# Patient Record
Sex: Female | Born: 1945 | Race: White | Hispanic: No | Smoking: Never smoker
Health system: Southern US, Community
[De-identification: ages and names within clinical notes are randomized; demographics above are authoritative.]

## PROBLEM LIST (undated history)

## (undated) DIAGNOSIS — J449 Chronic obstructive pulmonary disease, unspecified: Secondary | ICD-10-CM

## (undated) DIAGNOSIS — M1990 Unspecified osteoarthritis, unspecified site: Secondary | ICD-10-CM

## (undated) DIAGNOSIS — B029 Zoster without complications: Secondary | ICD-10-CM

## (undated) HISTORY — PX: ABDOMINAL HYSTERECTOMY: SHX81

## (undated) HISTORY — PX: TONSILLECTOMY: SUR1361

## (undated) HISTORY — PX: APPENDECTOMY: SHX54

## (undated) HISTORY — PX: CERVICAL SPINE SURGERY: SHX589

---

## 1998-12-20 ENCOUNTER — Ambulatory Visit (HOSPITAL_COMMUNITY): Admission: RE | Admit: 1998-12-20 | Discharge: 1998-12-20 | Payer: Self-pay | Admitting: *Deleted

## 1998-12-25 ENCOUNTER — Ambulatory Visit (HOSPITAL_COMMUNITY): Admission: RE | Admit: 1998-12-25 | Discharge: 1998-12-25 | Payer: Self-pay | Admitting: *Deleted

## 1998-12-26 ENCOUNTER — Emergency Department (HOSPITAL_COMMUNITY): Admission: EM | Admit: 1998-12-26 | Discharge: 1998-12-26 | Payer: Self-pay | Admitting: Emergency Medicine

## 1998-12-27 ENCOUNTER — Encounter: Payer: Self-pay | Admitting: Emergency Medicine

## 1999-02-10 ENCOUNTER — Emergency Department (HOSPITAL_COMMUNITY): Admission: EM | Admit: 1999-02-10 | Discharge: 1999-02-10 | Payer: Self-pay | Admitting: Emergency Medicine

## 1999-02-10 ENCOUNTER — Encounter: Payer: Self-pay | Admitting: Emergency Medicine

## 1999-06-05 ENCOUNTER — Other Ambulatory Visit: Admission: RE | Admit: 1999-06-05 | Discharge: 1999-06-05 | Payer: Self-pay | Admitting: Family Medicine

## 1999-10-24 ENCOUNTER — Emergency Department (HOSPITAL_COMMUNITY): Admission: EM | Admit: 1999-10-24 | Discharge: 1999-10-24 | Payer: Self-pay | Admitting: *Deleted

## 2000-06-30 ENCOUNTER — Encounter: Payer: Self-pay | Admitting: Emergency Medicine

## 2000-06-30 ENCOUNTER — Emergency Department (HOSPITAL_COMMUNITY): Admission: EM | Admit: 2000-06-30 | Discharge: 2000-06-30 | Payer: Self-pay | Admitting: Emergency Medicine

## 2001-01-22 ENCOUNTER — Emergency Department (HOSPITAL_COMMUNITY): Admission: EM | Admit: 2001-01-22 | Discharge: 2001-01-22 | Payer: Self-pay | Admitting: Emergency Medicine

## 2001-01-26 ENCOUNTER — Encounter: Payer: Self-pay | Admitting: Emergency Medicine

## 2001-01-26 ENCOUNTER — Ambulatory Visit (HOSPITAL_COMMUNITY): Admission: RE | Admit: 2001-01-26 | Discharge: 2001-01-26 | Payer: Self-pay | Admitting: Emergency Medicine

## 2001-01-26 ENCOUNTER — Emergency Department (HOSPITAL_COMMUNITY): Admission: EM | Admit: 2001-01-26 | Discharge: 2001-01-26 | Payer: Self-pay | Admitting: Emergency Medicine

## 2001-03-10 ENCOUNTER — Ambulatory Visit (HOSPITAL_COMMUNITY): Admission: RE | Admit: 2001-03-10 | Discharge: 2001-03-10 | Payer: Self-pay | Admitting: Neurosurgery

## 2001-03-10 ENCOUNTER — Encounter: Payer: Self-pay | Admitting: Neurosurgery

## 2001-06-07 ENCOUNTER — Encounter
Admission: RE | Admit: 2001-06-07 | Discharge: 2001-06-24 | Payer: Self-pay | Admitting: Physical Medicine & Rehabilitation

## 2001-09-14 ENCOUNTER — Emergency Department (HOSPITAL_COMMUNITY): Admission: EM | Admit: 2001-09-14 | Discharge: 2001-09-14 | Payer: Self-pay | Admitting: Emergency Medicine

## 2001-11-04 ENCOUNTER — Encounter
Admission: RE | Admit: 2001-11-04 | Discharge: 2002-02-02 | Payer: Self-pay | Admitting: Physical Medicine & Rehabilitation

## 2001-11-21 ENCOUNTER — Encounter: Payer: Self-pay | Admitting: Emergency Medicine

## 2001-11-21 ENCOUNTER — Emergency Department (HOSPITAL_COMMUNITY): Admission: EM | Admit: 2001-11-21 | Discharge: 2001-11-21 | Payer: Self-pay | Admitting: *Deleted

## 2002-10-12 ENCOUNTER — Encounter: Payer: Self-pay | Admitting: Emergency Medicine

## 2002-10-12 ENCOUNTER — Inpatient Hospital Stay (HOSPITAL_COMMUNITY): Admission: EM | Admit: 2002-10-12 | Discharge: 2002-10-13 | Payer: Self-pay | Admitting: Emergency Medicine

## 2003-04-15 ENCOUNTER — Encounter: Payer: Self-pay | Admitting: Emergency Medicine

## 2003-04-15 ENCOUNTER — Emergency Department (HOSPITAL_COMMUNITY): Admission: EM | Admit: 2003-04-15 | Discharge: 2003-04-15 | Payer: Self-pay | Admitting: Emergency Medicine

## 2003-11-19 ENCOUNTER — Emergency Department (HOSPITAL_COMMUNITY): Admission: EM | Admit: 2003-11-19 | Discharge: 2003-11-19 | Payer: Self-pay | Admitting: Emergency Medicine

## 2004-01-20 ENCOUNTER — Emergency Department (HOSPITAL_COMMUNITY): Admission: EM | Admit: 2004-01-20 | Discharge: 2004-01-21 | Payer: Self-pay | Admitting: Emergency Medicine

## 2004-06-08 ENCOUNTER — Encounter: Admission: RE | Admit: 2004-06-08 | Discharge: 2004-06-08 | Payer: Self-pay | Admitting: Internal Medicine

## 2004-06-19 ENCOUNTER — Encounter: Admission: RE | Admit: 2004-06-19 | Discharge: 2004-06-19 | Payer: Self-pay | Admitting: Internal Medicine

## 2004-09-15 ENCOUNTER — Inpatient Hospital Stay (HOSPITAL_COMMUNITY): Admission: EM | Admit: 2004-09-15 | Discharge: 2004-09-20 | Payer: Self-pay | Admitting: Emergency Medicine

## 2004-09-24 ENCOUNTER — Ambulatory Visit (HOSPITAL_COMMUNITY): Admission: RE | Admit: 2004-09-24 | Discharge: 2004-09-24 | Payer: Self-pay | Admitting: Neurosurgery

## 2004-10-01 ENCOUNTER — Inpatient Hospital Stay (HOSPITAL_COMMUNITY): Admission: RE | Admit: 2004-10-01 | Discharge: 2004-10-03 | Payer: Self-pay | Admitting: Neurosurgery

## 2005-09-03 ENCOUNTER — Ambulatory Visit (HOSPITAL_COMMUNITY): Admission: RE | Admit: 2005-09-03 | Discharge: 2005-09-03 | Payer: Self-pay | Admitting: Neurosurgery

## 2006-05-11 ENCOUNTER — Emergency Department (HOSPITAL_COMMUNITY): Admission: EM | Admit: 2006-05-11 | Discharge: 2006-05-11 | Payer: Self-pay | Admitting: Emergency Medicine

## 2006-09-12 ENCOUNTER — Inpatient Hospital Stay (HOSPITAL_COMMUNITY): Admission: EM | Admit: 2006-09-12 | Discharge: 2006-09-15 | Payer: Self-pay | Admitting: Emergency Medicine

## 2006-09-14 ENCOUNTER — Encounter: Payer: Self-pay | Admitting: Vascular Surgery

## 2006-10-21 ENCOUNTER — Inpatient Hospital Stay (HOSPITAL_COMMUNITY): Admission: EM | Admit: 2006-10-21 | Discharge: 2006-10-22 | Payer: Self-pay | Admitting: Emergency Medicine

## 2006-10-21 ENCOUNTER — Ambulatory Visit: Payer: Self-pay | Admitting: Cardiology

## 2006-10-22 ENCOUNTER — Encounter: Payer: Self-pay | Admitting: Cardiology

## 2007-08-17 ENCOUNTER — Inpatient Hospital Stay (HOSPITAL_COMMUNITY): Admission: EM | Admit: 2007-08-17 | Discharge: 2007-08-21 | Payer: Self-pay | Admitting: Emergency Medicine

## 2007-08-17 ENCOUNTER — Ambulatory Visit: Payer: Self-pay | Admitting: Cardiovascular Disease

## 2007-08-19 ENCOUNTER — Encounter: Payer: Self-pay | Admitting: Cardiovascular Disease

## 2007-08-21 ENCOUNTER — Other Ambulatory Visit: Payer: Self-pay | Admitting: Cardiology

## 2007-09-15 ENCOUNTER — Emergency Department (HOSPITAL_COMMUNITY): Admission: EM | Admit: 2007-09-15 | Discharge: 2007-09-15 | Payer: Self-pay | Admitting: Emergency Medicine

## 2007-11-24 ENCOUNTER — Encounter: Admission: RE | Admit: 2007-11-24 | Discharge: 2007-11-24 | Payer: Self-pay | Admitting: Family Medicine

## 2007-12-05 ENCOUNTER — Inpatient Hospital Stay (HOSPITAL_COMMUNITY): Admission: EM | Admit: 2007-12-05 | Discharge: 2007-12-09 | Payer: Self-pay | Admitting: Emergency Medicine

## 2007-12-27 ENCOUNTER — Ambulatory Visit (HOSPITAL_COMMUNITY): Admission: RE | Admit: 2007-12-27 | Discharge: 2007-12-27 | Payer: Self-pay | Admitting: Neurosurgery

## 2008-01-21 ENCOUNTER — Ambulatory Visit (HOSPITAL_COMMUNITY): Admission: RE | Admit: 2008-01-21 | Discharge: 2008-01-21 | Payer: Self-pay | Admitting: Neurosurgery

## 2008-01-30 ENCOUNTER — Emergency Department (HOSPITAL_COMMUNITY): Admission: EM | Admit: 2008-01-30 | Discharge: 2008-01-30 | Payer: Self-pay | Admitting: Emergency Medicine

## 2008-02-02 ENCOUNTER — Emergency Department (HOSPITAL_COMMUNITY): Admission: EM | Admit: 2008-02-02 | Discharge: 2008-02-02 | Payer: Self-pay | Admitting: Emergency Medicine

## 2008-02-09 ENCOUNTER — Ambulatory Visit (HOSPITAL_COMMUNITY): Admission: RE | Admit: 2008-02-09 | Discharge: 2008-02-09 | Payer: Self-pay | Admitting: Gastroenterology

## 2008-11-19 ENCOUNTER — Emergency Department (HOSPITAL_COMMUNITY): Admission: EM | Admit: 2008-11-19 | Discharge: 2008-11-19 | Payer: Self-pay | Admitting: Emergency Medicine

## 2009-02-14 ENCOUNTER — Encounter: Admission: RE | Admit: 2009-02-14 | Discharge: 2009-02-14 | Payer: Self-pay | Admitting: Family Medicine

## 2009-03-08 ENCOUNTER — Encounter: Admission: RE | Admit: 2009-03-08 | Discharge: 2009-03-08 | Payer: Self-pay | Admitting: Gastroenterology

## 2009-04-12 ENCOUNTER — Emergency Department (HOSPITAL_COMMUNITY): Admission: EM | Admit: 2009-04-12 | Discharge: 2009-04-12 | Payer: Self-pay | Admitting: Emergency Medicine

## 2009-06-01 IMAGING — CR DG CHEST 1V PORT
1 series · 1 of 1 positions shown · non-contrast
Comparison: 10/21/06.

CLINICAL DATA: 61-year-old female, severe chest pain. 
PORTABLE CHEST ? 1 VIEW:

[view not recorded]
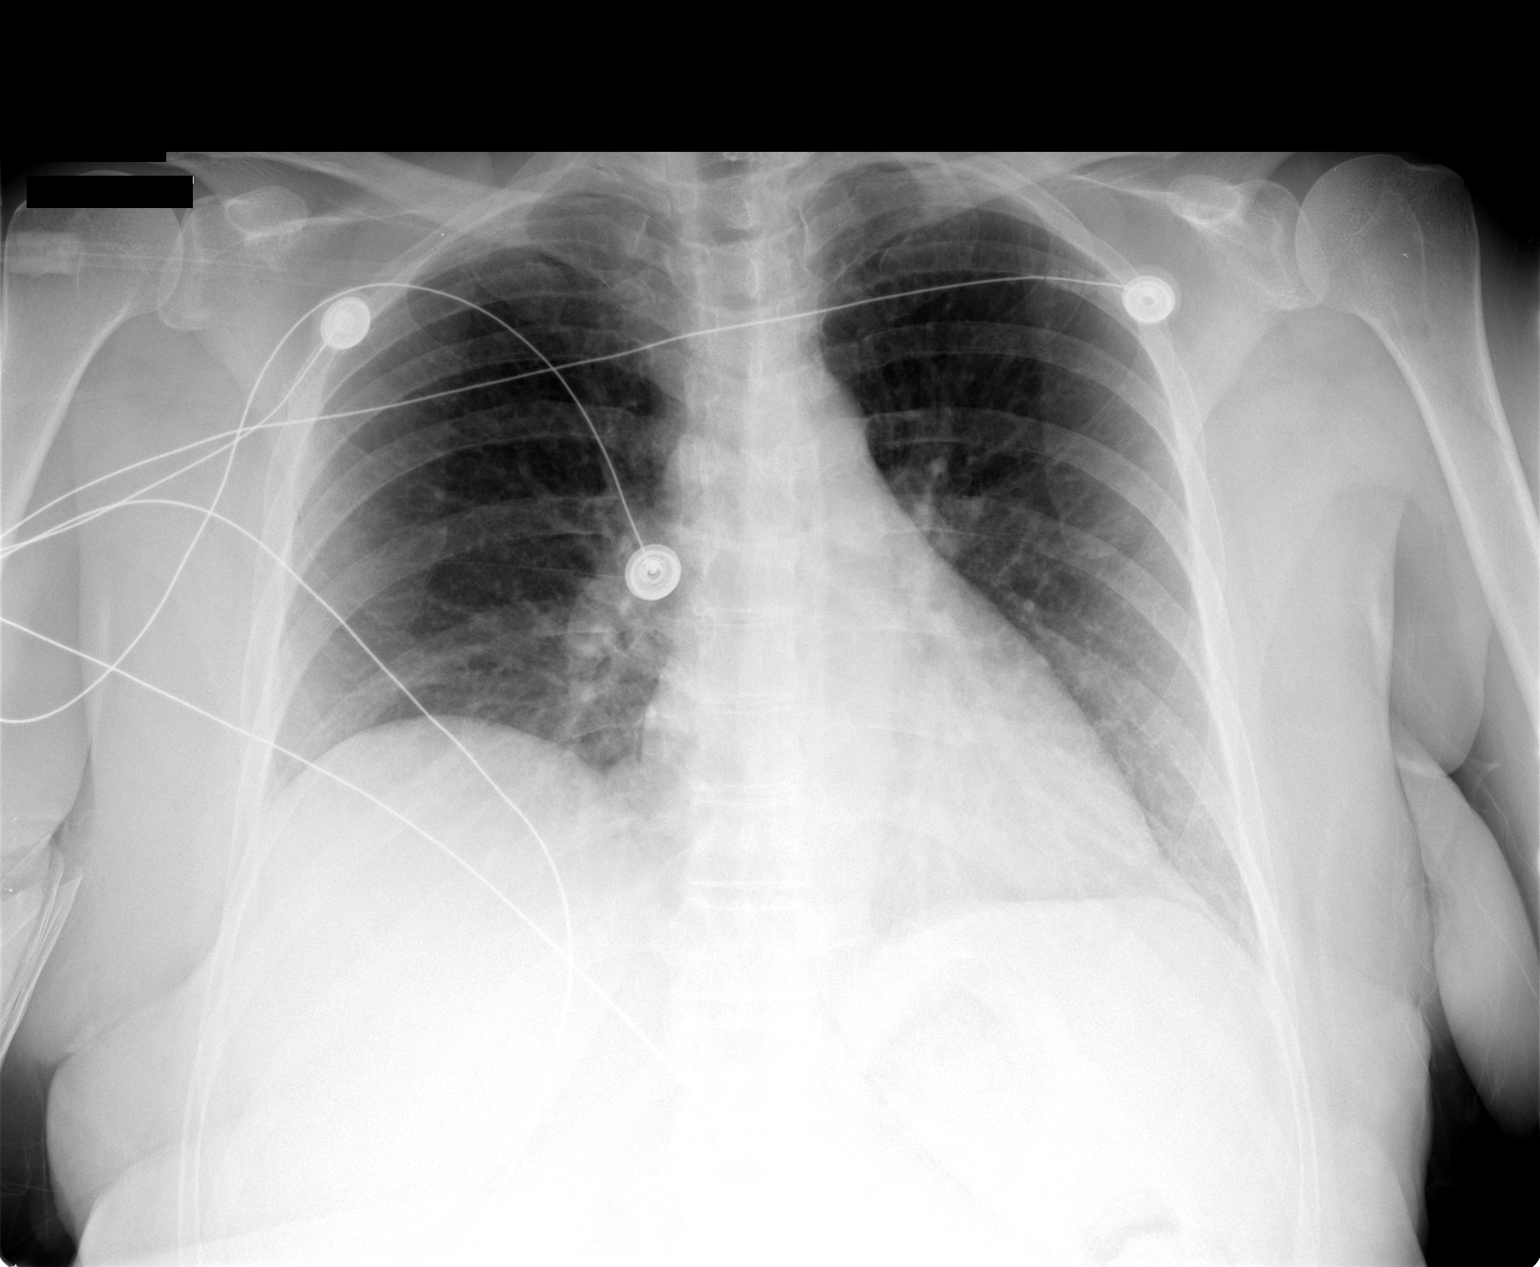

[1 of 1 positions shown; findings below may reference images not displayed]

FINDINGS: Right hemidiaphragm is chronically elevated.  Normal heart size.  No acute pneumonia, edema, effusion, or pneumothorax.  Cervical fusion hardware is noted.
IMPRESSION: Stable exam without acute chest process.

## 2009-08-15 ENCOUNTER — Emergency Department (HOSPITAL_BASED_OUTPATIENT_CLINIC_OR_DEPARTMENT_OTHER): Admission: EM | Admit: 2009-08-15 | Discharge: 2009-08-15 | Payer: Self-pay | Admitting: Emergency Medicine

## 2009-08-18 ENCOUNTER — Emergency Department (HOSPITAL_BASED_OUTPATIENT_CLINIC_OR_DEPARTMENT_OTHER): Admission: EM | Admit: 2009-08-18 | Discharge: 2009-08-18 | Payer: Self-pay | Admitting: Emergency Medicine

## 2010-04-07 ENCOUNTER — Emergency Department (HOSPITAL_BASED_OUTPATIENT_CLINIC_OR_DEPARTMENT_OTHER): Admission: EM | Admit: 2010-04-07 | Discharge: 2010-04-08 | Payer: Self-pay | Admitting: Emergency Medicine

## 2010-06-18 ENCOUNTER — Observation Stay (HOSPITAL_COMMUNITY)
Admission: RE | Admit: 2010-06-18 | Discharge: 2010-06-19 | Payer: Self-pay | Source: Home / Self Care | Admitting: Cardiovascular Disease

## 2010-06-18 ENCOUNTER — Encounter: Payer: Self-pay | Admitting: Emergency Medicine

## 2010-08-18 ENCOUNTER — Emergency Department (HOSPITAL_BASED_OUTPATIENT_CLINIC_OR_DEPARTMENT_OTHER): Admission: EM | Admit: 2010-08-18 | Discharge: 2010-08-18 | Payer: Self-pay | Admitting: Pediatrics

## 2010-11-15 ENCOUNTER — Emergency Department (INDEPENDENT_AMBULATORY_CARE_PROVIDER_SITE_OTHER): Payer: Medicare Other

## 2010-11-15 ENCOUNTER — Emergency Department (HOSPITAL_BASED_OUTPATIENT_CLINIC_OR_DEPARTMENT_OTHER)
Admission: EM | Admit: 2010-11-15 | Discharge: 2010-11-15 | Disposition: A | Discharge status: Home / Self Care | Payer: Medicare Other | Attending: Emergency Medicine | Admitting: Emergency Medicine

## 2010-11-15 DIAGNOSIS — R05 Cough: Secondary | ICD-10-CM | POA: Insufficient documentation

## 2010-11-15 DIAGNOSIS — R059 Cough, unspecified: Secondary | ICD-10-CM | POA: Insufficient documentation

## 2010-11-15 DIAGNOSIS — J40 Bronchitis, not specified as acute or chronic: Secondary | ICD-10-CM | POA: Insufficient documentation

## 2010-11-15 DIAGNOSIS — R0989 Other specified symptoms and signs involving the circulatory and respiratory systems: Secondary | ICD-10-CM

## 2010-11-15 DIAGNOSIS — J329 Chronic sinusitis, unspecified: Secondary | ICD-10-CM | POA: Insufficient documentation

## 2010-11-17 ENCOUNTER — Emergency Department (HOSPITAL_BASED_OUTPATIENT_CLINIC_OR_DEPARTMENT_OTHER)
Admission: EM | Admit: 2010-11-17 | Discharge: 2010-11-17 | Disposition: A | Discharge status: Home / Self Care | Payer: Self-pay | Attending: Emergency Medicine | Admitting: Emergency Medicine

## 2010-11-17 DIAGNOSIS — R42 Dizziness and giddiness: Secondary | ICD-10-CM | POA: Insufficient documentation

## 2010-11-17 DIAGNOSIS — R0989 Other specified symptoms and signs involving the circulatory and respiratory systems: Secondary | ICD-10-CM | POA: Insufficient documentation

## 2010-11-17 DIAGNOSIS — R0609 Other forms of dyspnea: Secondary | ICD-10-CM | POA: Insufficient documentation

## 2010-11-17 DIAGNOSIS — R059 Cough, unspecified: Secondary | ICD-10-CM | POA: Insufficient documentation

## 2010-11-17 DIAGNOSIS — R05 Cough: Secondary | ICD-10-CM | POA: Insufficient documentation

## 2010-11-17 DIAGNOSIS — R5381 Other malaise: Secondary | ICD-10-CM | POA: Insufficient documentation

## 2010-11-17 DIAGNOSIS — J40 Bronchitis, not specified as acute or chronic: Secondary | ICD-10-CM | POA: Insufficient documentation

## 2010-11-17 DIAGNOSIS — R002 Palpitations: Secondary | ICD-10-CM | POA: Insufficient documentation

## 2010-11-17 DIAGNOSIS — R630 Anorexia: Secondary | ICD-10-CM | POA: Insufficient documentation

## 2010-11-17 LAB — POCT CARDIAC MARKERS
CKMB, poc: 1.8 ng/mL (ref 1.0–8.0)
Myoglobin, poc: 118 ng/mL (ref 12–200)
Troponin i, poc: <0.05 ng/mL (ref 0.00–0.09)

## 2010-11-17 LAB — BASIC METABOLIC PANEL
CO2: 22 mEq/L (ref 19–32)
Calcium: 9.2 mg/dL (ref 8.4–10.5)
Chloride: 109 mEq/L (ref 96–112)
GFR calc Af Amer: >60 mL/min (ref >60)
Potassium: 4.9 mEq/L (ref 3.5–5.1)
Sodium: 144 mEq/L (ref 135–145)

## 2010-11-17 LAB — DIFFERENTIAL
Basophils Absolute: 0 10*3/uL (ref 0.0–0.1)
Basophils Relative: 1 % (ref 0–1)
Lymphocytes Relative: 20 % (ref 12–46)
Neutro Abs: 3.5 10*3/uL (ref 1.7–7.7)
Neutrophils Relative %: 60 % (ref 43–77)

## 2010-11-17 LAB — CBC
Hemoglobin: 13.4 g/dL (ref 12.0–15.0)
MCHC: 35.3 g/dL (ref 30.0–36.0)
RBC: 4.59 MIL/uL (ref 3.87–5.11)
WBC: 5.9 10*3/uL (ref 4.0–10.5)

## 2010-11-21 ENCOUNTER — Emergency Department (HOSPITAL_BASED_OUTPATIENT_CLINIC_OR_DEPARTMENT_OTHER)
Admission: EM | Admit: 2010-11-21 | Discharge: 2010-11-21 | Disposition: A | Discharge status: Home / Self Care | Payer: Medicare Other | Attending: Emergency Medicine | Admitting: Emergency Medicine

## 2010-11-21 ENCOUNTER — Emergency Department (INDEPENDENT_AMBULATORY_CARE_PROVIDER_SITE_OTHER): Payer: Medicare Other

## 2010-11-21 DIAGNOSIS — R059 Cough, unspecified: Secondary | ICD-10-CM | POA: Insufficient documentation

## 2010-11-21 DIAGNOSIS — R05 Cough: Secondary | ICD-10-CM

## 2010-11-21 DIAGNOSIS — J40 Bronchitis, not specified as acute or chronic: Secondary | ICD-10-CM | POA: Insufficient documentation

## 2010-11-23 ENCOUNTER — Emergency Department (HOSPITAL_BASED_OUTPATIENT_CLINIC_OR_DEPARTMENT_OTHER)
Admission: EM | Admit: 2010-11-23 | Discharge: 2010-11-24 | Disposition: A | Discharge status: Home / Self Care | Payer: Medicare Other | Attending: Emergency Medicine | Admitting: Emergency Medicine

## 2010-11-23 ENCOUNTER — Emergency Department (INDEPENDENT_AMBULATORY_CARE_PROVIDER_SITE_OTHER): Payer: Self-pay

## 2010-11-23 DIAGNOSIS — J40 Bronchitis, not specified as acute or chronic: Secondary | ICD-10-CM | POA: Insufficient documentation

## 2010-11-23 DIAGNOSIS — R071 Chest pain on breathing: Secondary | ICD-10-CM | POA: Insufficient documentation

## 2010-11-23 DIAGNOSIS — R0789 Other chest pain: Secondary | ICD-10-CM | POA: Insufficient documentation

## 2010-11-23 DIAGNOSIS — R079 Chest pain, unspecified: Secondary | ICD-10-CM

## 2010-11-23 LAB — DIFFERENTIAL
Basophils Absolute: 0.1 10*3/uL (ref 0.0–0.1)
Basophils Relative: 1 % (ref 0–1)
Monocytes Absolute: 0.8 10*3/uL (ref 0.1–1.0)
Neutro Abs: 7.4 10*3/uL (ref 1.7–7.7)

## 2010-11-23 LAB — COMPREHENSIVE METABOLIC PANEL
Alkaline Phosphatase: 117 U/L (ref 39–117)
BUN: 8 mg/dL (ref 6–23)
CO2: 26 mEq/L (ref 19–32)
Calcium: 9.8 mg/dL (ref 8.4–10.5)
GFR calc non Af Amer: >60 mL/min (ref >60)
Glucose, Bld: 108 mg/dL — ABNORMAL HIGH (ref 70–99)
Potassium: 3.9 mEq/L (ref 3.5–5.1)
Total Protein: 8.3 g/dL (ref 6.0–8.3)

## 2010-11-23 LAB — CBC
HCT: 37.8 % (ref 36.0–46.0)
Hemoglobin: 13.4 g/dL (ref 12.0–15.0)
MCHC: 35.4 g/dL (ref 30.0–36.0)
MCV: 82.9 fL (ref 78.0–100.0)
RDW: 13.4 % (ref 11.5–15.5)

## 2010-11-23 MED ORDER — IOHEXOL 350 MG/ML SOLN
80.0000 mL | Freq: Once | INTRAVENOUS | Status: AC | PRN
  Administered 2010-11-23: 80 mL via INTRAVENOUS

## 2010-11-26 LAB — URINALYSIS, ROUTINE W REFLEX MICROSCOPIC
Glucose, UA: NEGATIVE mg/dL
Ketones, ur: NEGATIVE mg/dL
Nitrite: NEGATIVE
Protein, ur: NEGATIVE mg/dL

## 2010-11-26 LAB — URINE MICROSCOPIC-ADD ON

## 2010-11-27 ENCOUNTER — Emergency Department (HOSPITAL_BASED_OUTPATIENT_CLINIC_OR_DEPARTMENT_OTHER)
Admission: EM | Admit: 2010-11-27 | Discharge: 2010-11-28 | Disposition: A | Discharge status: Home / Self Care | Payer: Medicare Other | Source: Home / Self Care | Attending: Emergency Medicine | Admitting: Emergency Medicine

## 2010-11-27 ENCOUNTER — Emergency Department (INDEPENDENT_AMBULATORY_CARE_PROVIDER_SITE_OTHER): Payer: Medicare Other

## 2010-11-27 DIAGNOSIS — R079 Chest pain, unspecified: Secondary | ICD-10-CM

## 2010-11-27 DIAGNOSIS — R05 Cough: Secondary | ICD-10-CM | POA: Insufficient documentation

## 2010-11-27 DIAGNOSIS — R112 Nausea with vomiting, unspecified: Secondary | ICD-10-CM

## 2010-11-27 DIAGNOSIS — R059 Cough, unspecified: Secondary | ICD-10-CM | POA: Insufficient documentation

## 2010-11-27 DIAGNOSIS — R1115 Cyclical vomiting syndrome unrelated to migraine: Secondary | ICD-10-CM | POA: Insufficient documentation

## 2010-11-27 DIAGNOSIS — J40 Bronchitis, not specified as acute or chronic: Secondary | ICD-10-CM | POA: Insufficient documentation

## 2010-11-27 LAB — COMPREHENSIVE METABOLIC PANEL
ALT: 62 U/L — ABNORMAL HIGH (ref 0–35)
AST: 38 U/L — ABNORMAL HIGH (ref 0–37)
Albumin: 3.8 g/dL (ref 3.5–5.2)
Albumin: 4.7 g/dL (ref 3.5–5.2)
Alkaline Phosphatase: 108 U/L (ref 39–117)
Alkaline Phosphatase: 81 U/L (ref 39–117)
BUN: 10 mg/dL (ref 6–23)
CO2: 22 mEq/L (ref 19–32)
CO2: 24 mEq/L (ref 19–32)
Chloride: 104 mEq/L (ref 96–112)
Chloride: 110 mEq/L (ref 96–112)
Creatinine, Ser: 0.8 mg/dL (ref 0.4–1.2)
GFR calc Af Amer: >60 mL/min (ref >60)
GFR calc non Af Amer: >60 mL/min (ref >60)
Glucose, Bld: 104 mg/dL — ABNORMAL HIGH (ref 70–99)
Potassium: 3.7 mEq/L (ref 3.5–5.1)
Potassium: 4.3 mEq/L (ref 3.5–5.1)
Total Bilirubin: 0.5 mg/dL (ref 0.3–1.2)
Total Bilirubin: 0.8 mg/dL (ref 0.3–1.2)

## 2010-11-27 LAB — DIFFERENTIAL
Basophils Absolute: 0 10*3/uL (ref 0.0–0.1)
Eosinophils Relative: 0 % (ref 0–5)
Lymphocytes Relative: 5 % — ABNORMAL LOW (ref 12–46)
Monocytes Relative: 9 % (ref 3–12)
Neutro Abs: 19.4 10*3/uL — ABNORMAL HIGH (ref 1.7–7.7)

## 2010-11-27 LAB — CBC
HCT: 38.9 % (ref 36.0–46.0)
Hemoglobin: 13.3 g/dL (ref 12.0–15.0)
Hemoglobin: 13.7 g/dL (ref 12.0–15.0)
MCH: 29.8 pg (ref 26.0–34.0)
MCV: 87 fL (ref 78.0–100.0)
Platelets: 229 10*3/uL (ref 150–400)
Platelets: 266 10*3/uL (ref 150–400)
RBC: 4.47 MIL/uL (ref 3.87–5.11)
RBC: 4.6 MIL/uL (ref 3.87–5.11)
WBC: 22.5 10*3/uL — ABNORMAL HIGH (ref 4.0–10.5)
WBC: 8.3 10*3/uL (ref 4.0–10.5)

## 2010-11-27 LAB — CARDIAC PANEL(CRET KIN+CKTOT+MB+TROPI)
CK, MB: 1.1 ng/mL (ref 0.3–4.0)
Relative Index: INVALID (ref 0.0–2.5)
Relative Index: INVALID (ref 0.0–2.5)
Total CK: 46 U/L (ref 7–177)

## 2010-11-27 LAB — HEMOGLOBIN A1C
Hgb A1c MFr Bld: 5.8 % — ABNORMAL HIGH (ref <5.7)
Mean Plasma Glucose: 120 mg/dL — ABNORMAL HIGH (ref <117)

## 2010-11-27 LAB — LIPID PANEL
LDL Cholesterol: 137 mg/dL — ABNORMAL HIGH (ref 0–99)
Triglycerides: 189 mg/dL — ABNORMAL HIGH (ref <150)
VLDL: 38 mg/dL (ref 0–40)

## 2010-11-27 LAB — MAGNESIUM: Magnesium: 2.1 mg/dL (ref 1.5–2.5)

## 2010-11-27 LAB — LIPASE, BLOOD: Lipase: 37 U/L (ref 23–300)

## 2010-11-28 ENCOUNTER — Inpatient Hospital Stay (HOSPITAL_COMMUNITY)
Admission: AD | Admit: 2010-11-28 | Discharge: 2010-11-30 | DRG: 194 | Disposition: A | Discharge status: Home / Self Care | Payer: Medicare Other | Source: Other Acute Inpatient Hospital | Attending: Internal Medicine | Admitting: Internal Medicine

## 2010-11-28 ENCOUNTER — Inpatient Hospital Stay (HOSPITAL_COMMUNITY): Payer: Medicare Other

## 2010-11-28 DIAGNOSIS — E785 Hyperlipidemia, unspecified: Secondary | ICD-10-CM | POA: Diagnosis present

## 2010-11-28 DIAGNOSIS — X58XXXA Exposure to other specified factors, initial encounter: Secondary | ICD-10-CM

## 2010-11-28 DIAGNOSIS — K219 Gastro-esophageal reflux disease without esophagitis: Secondary | ICD-10-CM | POA: Diagnosis present

## 2010-11-28 DIAGNOSIS — J189 Pneumonia, unspecified organism: Principal | ICD-10-CM | POA: Diagnosis present

## 2010-11-28 DIAGNOSIS — D72829 Elevated white blood cell count, unspecified: Secondary | ICD-10-CM | POA: Diagnosis present

## 2010-11-28 DIAGNOSIS — Z8673 Personal history of transient ischemic attack (TIA), and cerebral infarction without residual deficits: Secondary | ICD-10-CM

## 2010-11-28 DIAGNOSIS — S2239XA Fracture of one rib, unspecified side, initial encounter for closed fracture: Secondary | ICD-10-CM | POA: Diagnosis present

## 2010-11-28 DIAGNOSIS — R071 Chest pain on breathing: Secondary | ICD-10-CM | POA: Diagnosis present

## 2010-11-28 DIAGNOSIS — R112 Nausea with vomiting, unspecified: Secondary | ICD-10-CM | POA: Diagnosis present

## 2010-11-28 LAB — URINALYSIS, ROUTINE W REFLEX MICROSCOPIC
Bilirubin Urine: NEGATIVE
Ketones, ur: NEGATIVE mg/dL
Nitrite: NEGATIVE
Specific Gravity, Urine: 1.024 (ref 1.005–1.030)
Urobilinogen, UA: 0.2 mg/dL (ref 0.0–1.0)
pH: 7 (ref 5.0–8.0)

## 2010-11-28 LAB — CARDIAC PANEL(CRET KIN+CKTOT+MB+TROPI)
CK, MB: 1 ng/mL (ref 0.3–4.0)
Total CK: 33 U/L (ref 7–177)

## 2010-11-28 LAB — POCT I-STAT, CHEM 8
Glucose, Bld: 114 mg/dL — ABNORMAL HIGH (ref 70–99)
HCT: 41 % (ref 36.0–46.0)
Hemoglobin: 13.9 g/dL (ref 12.0–15.0)
Potassium: 3.7 mEq/L (ref 3.5–5.1)
Sodium: 141 mEq/L (ref 135–145)

## 2010-11-28 LAB — CBC
MCH: 31 pg (ref 26.0–34.0)
MCHC: 35.6 g/dL (ref 30.0–36.0)
Platelets: 181 10*3/uL (ref 150–400)
RDW: 13.2 % (ref 11.5–15.5)

## 2010-11-28 LAB — DIFFERENTIAL
Basophils Absolute: 0 10*3/uL (ref 0.0–0.1)
Basophils Relative: 0 % (ref 0–1)
Eosinophils Absolute: 0.1 10*3/uL (ref 0.0–0.7)
Monocytes Relative: 5 % (ref 3–12)
Neutrophils Relative %: 66 % (ref 43–77)

## 2010-11-28 LAB — POCT CARDIAC MARKERS: CKMB, poc: <1 ng/mL — ABNORMAL LOW (ref 1.0–8.0)

## 2010-11-28 LAB — D-DIMER, QUANTITATIVE: D-Dimer, Quant: 0.22 ug/mL-FEU (ref 0.00–0.48)

## 2010-11-28 LAB — CK TOTAL AND CKMB (NOT AT ARMC): CK, MB: 1.2 ng/mL (ref 0.3–4.0)

## 2010-11-29 LAB — COMPREHENSIVE METABOLIC PANEL
AST: 16 U/L (ref 0–37)
Albumin: 2.9 g/dL — ABNORMAL LOW (ref 3.5–5.2)
BUN: 8 mg/dL (ref 6–23)
Chloride: 107 mEq/L (ref 96–112)
Creatinine, Ser: 0.74 mg/dL (ref 0.4–1.2)
GFR calc Af Amer: >60 mL/min (ref >60)
Total Protein: 5.6 g/dL — ABNORMAL LOW (ref 6.0–8.3)

## 2010-11-29 LAB — CBC
MCH: 28.9 pg (ref 26.0–34.0)
MCV: 85.7 fL (ref 78.0–100.0)
Platelets: 173 10*3/uL (ref 150–400)
RBC: 3.84 MIL/uL — ABNORMAL LOW (ref 3.87–5.11)
RDW: 13.8 % (ref 11.5–15.5)
WBC: 7.6 10*3/uL (ref 4.0–10.5)

## 2010-11-29 LAB — URINE CULTURE
Colony Count: NO GROWTH
Culture  Setup Time: 201203151025
Culture: NO GROWTH

## 2010-11-30 LAB — CBC
HCT: 36.1 % (ref 36.0–46.0)
MCHC: 34.3 g/dL (ref 30.0–36.0)
Platelets: 201 10*3/uL (ref 150–400)
RDW: 14.1 % (ref 11.5–15.5)
WBC: 7.5 10*3/uL (ref 4.0–10.5)

## 2010-11-30 LAB — BASIC METABOLIC PANEL
Calcium: 8.5 mg/dL (ref 8.4–10.5)
GFR calc non Af Amer: >60 mL/min (ref >60)
Glucose, Bld: 99 mg/dL (ref 70–99)
Sodium: 138 mEq/L (ref 135–145)

## 2010-12-04 LAB — CULTURE, BLOOD (ROUTINE X 2)
Culture  Setup Time: 201203151019
Culture  Setup Time: 201203151019
Culture: NO GROWTH

## 2010-12-16 NOTE — H&P (Signed)
[name], BELFIELD NO.:  [id number]  MEDICAL RECORD NO.:  [id number]           PATIENT TYPE:  I  LOCATION:  5118                         FACILITY:  [facility]  PHYSICIAN:  Eduard Clos, MDDATE OF BIRTH:  1946-07-22  DATE OF ADMISSION:  11/28/2010 DATE OF DISCHARGE:                             HISTORY & PHYSICAL   PRIMARY CARE PHYSICIAN:  Unassigned.  CHIEF COMPLAINT:  Right-sided chest pain.  HISTORY OF PRESENT ILLNESS:  A 65 year old female with previous history of having CVA, dyslipidemia, and anxiety, has been experiencing some right-sided chest pain with shortness of breath.  The patient has been having these symptoms over the last 3 weeks with cough and phlegm.  The patient had originally come to the ER 5 days ago where at that time the patient did have CT angio of chest and it was negative for PE, was showing mild trace pleural fluid on the right side with some central peribronchial thickening.  The patient was discharged on Z-Pak and prednisone despite taking which patient came last night to the ER back in addition to having nausea and vomiting.  The patient still has a stabbing-type of right-sided pleuritic chest pain.  The patient had a repeat chest x-ray which only shows features of bronchitis.  The patient has been admitted for further workup.  In addition, the patient at this time has significant leukocytosis.  Denies any dizziness or loss of consciousness.  Denied any focal deficit.  Denied any headache, visual symptoms, any dysuria, discharge, or diarrhea.  The patient's pain is more on the right chest, below the right lower ribs, does not have specifically pain in the right upper quadrant.  At this time is also concerning for cholecystitis.  PAST MEDICAL HISTORY:  History of CVA; dyslipidemia; GERD; diverticulosis; anxiety; paranoid, which the patient had followed with Dr. Jeannetta Nap and was told was normal; history of C5-C6 and  C6-C7 diskectomies; bilateral laminectomy and interbody fusion with allograft placed at C5 and C6-7; status post hysterectomy; bilateral salpingo- oophorectomy; status post appendectomy; and status post excision of benign tumor.  MEDICATIONS PRIOR TO ADMISSION:  Fish oil, flaxseed, and aspirin.  ALLERGIES:  MORPHINE causes hives, PHENERGAN causes itching.  SOCIAL HISTORY:  The patient is married.  Denies smoking cigarettes, drinking alcohol, or using illegal drugs.  FAMILY HISTORY:  Significant for coronary artery disease and diabetes. Father had lung cancer.  Brother had died from kidney cancer.  REVIEW OF SYSTEMS:  As per history of present illness, nothing else significant.  PHYSICAL EXAMINATION:  GENERAL:  The patient examined at bedside, not in acute distress. VITAL SIGNS:  Blood pressure is 128/70, pulse is 90 per minute, temperature 97.8, respirations 18 per minute, O2 sat is 96%.  HEENT: Anicteric.  No pallor.  No discharge from ears, eyes, nose, or mouth. CHEST:  Bilateral air entry present.  No rhonchi, no crepitation. HEART:  S1 and S2 heard. ABDOMEN:  Soft, nontender.  Bowel sounds heard. CNS:  Alert, awake, and oriented to time, place, and person.  Moves upper and lower extremities 5/5. EXTREMITIES:  Peripheral pulses felt.  No edema.  LABORATORY  DATA:  EKG has been ordered.  Chest x-ray done today shows mild central peribronchial thickening noted, better characterized on the recent CTA of the chest from November 23, 2010.  No evidence of new focal consolidation, known trace right pleural effusion is not characterized on radiograph.  CT angio of chest done on November 23, 2010, shows central bronchial wall thickening, trace right effusion and mildly prominent right hilar lymph node.  Findings could represent bronchitis or other small airways infectious etiology, reactive airway disease could have a similar appearance, but the presence of a right effusion makes this  less likely.  No specific evidence for pulmonary embolism is identified. CBC; WBC is 22.5, hemoglobin is 13.7, hematocrit 38.5, platelets 266, neutrophils 86%.  Complete metabolic panel; sodium 145, potassium 3.7, chloride 104, carbon dioxide 22, glucose 127, BUN 19, creatinine 0.7, total bilirubin is 0.8, alkaline phosphatase 108, AST 38, ALT 62, total protein 7.8, albumin is 4.7, calcium 9.5, lipase 77.  ASSESSMENT: 1. Pleuritic type right-sided chest pain. 2. Nausea and vomiting. 3. Mild leukocytosis. 4. History of dyslipidemia. 5. History of cerebrovascular accident.  PLAN: 1. At this time, admit patient to medical floor. 2. For her pleuritic-type chest pain, at this time is concerning for     any loculated pleural effusion and a pneumonic process.  We are     going to start empirically vancomycin and Levaquin at this time.     We are going to get a repeat CT of chest to confirm there is no     loculated effusion.  If there is, then we may have to call CT     Surgery.  At the same time, it is also concerning for any     gallbladder pathology as patient has significant nausea, vomiting.     We will also get a sonogram of the abdomen. 3. Further recommendation based on these test orders.     Eduard Clos, MD     ANK/MEDQ  D:  11/28/2010  T:  11/28/2010  Job:  [id number]  Electronically Signed by Midge Minium MD on 12/16/2010 07:51:19 AM

## 2010-12-19 ENCOUNTER — Emergency Department (HOSPITAL_BASED_OUTPATIENT_CLINIC_OR_DEPARTMENT_OTHER)
Admission: EM | Admit: 2010-12-19 | Discharge: 2010-12-19 | Disposition: A | Discharge status: Home / Self Care | Payer: Self-pay | Attending: Emergency Medicine | Admitting: Emergency Medicine

## 2010-12-19 DIAGNOSIS — K1370 Unspecified lesions of oral mucosa: Secondary | ICD-10-CM | POA: Insufficient documentation

## 2010-12-19 LAB — URINALYSIS, ROUTINE W REFLEX MICROSCOPIC
Bilirubin Urine: NEGATIVE
Ketones, ur: NEGATIVE mg/dL
Nitrite: NEGATIVE
Specific Gravity, Urine: 1.009 (ref 1.005–1.030)
Urobilinogen, UA: 0.2 mg/dL (ref 0.0–1.0)

## 2011-01-28 NOTE — Discharge Summary (Signed)
[name], GOATES NO.:  [id number]   MEDICAL RECORD NO.:  [id number]          PATIENT TYPE:  INP   LOCATION:  1334                         FACILITY:  [facility]   PHYSICIAN:  Hillery Aldo, M.D.   DATE OF BIRTH:  1945/10/02   DATE OF ADMISSION:  12/05/2007  DATE OF DISCHARGE:  12/08/2007                               DISCHARGE SUMMARY   PRIMARY CARE PHYSICIAN:  Windle Guard, M.D.   DISCHARGE DIAGNOSES:  1. Acute diverticulitis.  2. Gastroesophageal reflux disease.  3. Transient hematuria.  4. Thyroid nodules, follow up by primary care physician recommended.  5. Anxiety.  6. History of dyslipidemia.  7. History of pulmonary embolism.   DISCHARGE MEDICATIONS:  1. Fish oil capsule once daily.  2. Aspirin 325 mg daily.  3. Mucinex 1 tablet twice daily as needed.  4. Flaxseed oil 2 capsules daily.  5. Cipro 500 mg q.12 h. x10 days.  6. Flagyl 500 mg q.8 h. x10 days.  7. Vicodin 5/500 mg 1-2 tablets q.6 h. p.r.n. pain.  8. Phenergan 25 mg q.4 h. p.r.n. nausea or vomiting.   CONSULTATIONS:  None.   BRIEF ADMISSION HPI:  The patient is a 65 year old female who presented  to the hospital with a chief complaint of left lower quadrant pain,  nausea, subjective fever and chills.  For the full details, please see  the dictated report by Dr. Sherrie Mustache.   PROCEDURES AND DIAGNOSTIC STUDIES:  CT scan of the abdomen and pelvis on  December 05, 2007, showed no acute abdominal process with distal descending  diverticulitis without abscess.   DISCHARGE LABORATORY VALUES:  Sodium was 138, potassium 4, chloride 104,  bicarb 27, BUN 6, creatinine 0.81, glucose 89.  White blood cell count  was 4.6, hemoglobin 12.5, hematocrit 35.6, platelets 171.   HOSPITAL COURSE:  1. Acute diverticulitis:  The patient was admitted and a diagnostic      workup undertaken with findings as noted above.  She was put on a      combination of IV Cipro and Flagyl for acute diverticulitis.  Initially she was kept on bowel rest and her diet gradually      advanced.  At this point, she is tolerating a diet without any      further problems with nausea or vomiting.  Her abdominal pain is      much improved.  The patient will be transitioned over to p.o.      antibiotics and if she is able to tolerate them, will be discharged      later on this afternoon.  2. Gastroesophageal reflux disease:  The patient was empirically put      on proton pump inhibitor therapy.  3. Transient hematuria:  The patient did have an episode of hematuria      with a small visible clot as noted by nursing staff.  She had no      history of previous blood in the urine.  A urinalysis was done      which did not show any evidence of hematuria.  Lovenox was  discontinued with no resumption of complaints of blood in the      urine.  4. History of thyroid nodules.  The patient should follow up with her      primary care [name] with regard to these.  5. Anxiety:  The patient was given p.r.n. anxiolytics without any      return of anxiety.  6. History of dyslipidemia:  The patient can continue her flaxseed oil      and fish oil on discharge.   DISPOSITION:  The patient is approaching medical stability and if she is  able to tolerate p.o. antibiotics and her diet, she will be discharged  later on this afternoon.      Hillery Aldo, M.D.  Electronically Signed     CR/MEDQ  D:  12/08/2007  T:  12/08/2007  Job:  [id number]   cc:   Windle Guard, M.D.  Fax: [phone]

## 2011-01-28 NOTE — Op Note (Signed)
[name], [name]               ACCOUNT NO.:  [id number]   MEDICAL RECORD NO.:  [id number]          PATIENT TYPE:  AMB   LOCATION:  SDS                          FACILITY:  [facility]   PHYSICIAN:  Hilda Lias, M.D.   DATE OF BIRTH:  16-Dec-1945   DATE OF PROCEDURE:  01/21/2008  DATE OF DISCHARGE:  01/21/2008                               OPERATIVE REPORT   PREOPERATIVE DIAGNOSIS:  Bilateral carpal tunnel syndrome, right worse  than left one.   POSTOPERATIVE DIAGNOSIS:  Bilateral carpal tunnel syndrome, right worse  than left one.   PROCEDURE:  Decompression of the right median nerve.   ANESTHESIA:  Local plus IV sedation.   CLINICAL HISTORY:  [name] is a lady who is being followed because  of back pain, arm pain, and the right hand is getting to the point where  she is unable to sleep and cannot open and close her hand.  Made the  diagnosis of  severe carpal tunnel syndrome, right worse than the left.  Surgery was advised and the risk was explained to her in the office.   PROCEDURE:  [name] was taken to the OR and then the right hand and  arm was cleaned with DuraPrep.  Drapes were applied.  IV sedation was  started and infiltration of the base of the thumb with 10 mL of  Xylocaine was done.  Then, a 0.5-mm incision was made through the skin  through the thick volar ligament and through the thick carpal ligament  with decompression, distal and proximal.  At the end, we had a good  decompression of the median nerve, which was found to be flat and  yellowish, then the area was irrigated.  Hemostasis was done with  bipolar and the wound was closed with nylon.           ______________________________  Hilda Lias, M.D.     EB/MEDQ  D:  01/21/2008  T:  01/22/2008  Job:  [id number]

## 2011-01-28 NOTE — Cardiovascular Report (Signed)
NAMENAKINA, SPATZ NO.:  [id number]   MEDICAL RECORD NO.:  [id number]          PATIENT TYPE:  INP   LOCATION:  2005                         FACILITY:  [facility]   PHYSICIAN:  Everardo Beals. Juanda Chance, MD, FACCDATE OF BIRTH:  1945-11-17   DATE OF PROCEDURE:  DATE OF DISCHARGE:  08/21/2007                            CARDIAC CATHETERIZATION   HISTORY:  Ms. Maduro is 65 years old and was admitted to the hospital  with chest pain.  She has positive risk factors including hyperlipidemia  and a history of previous stroke.  She was evaluated with a Myoview  scan.  This suggested anterior ischemia and she was scheduled for  catheterization today.   PROCEDURE:  The procedure was performed via the right femoral arteries  and arterial sheath and a 5-French preformed coronary catheters.  A  front wall arterial puncture was performed and Omnipaque contrast was  used.  The patient tolerated the procedure and left the  laboratory in  satisfactory condition.   RESULTS:  Left main coronary:  The left main coronary is a very short  vessel and free of significant disease.   Left anterior descending artery:  The left anterior descending artery  gave rise to a large diagonal branch, 2 septal perforators and a small  diagonal branch.  These and the LAD proper were free of significant  disease.   Circumflex artery:  The circumflex artery gave rise to a large marginal  branch, 2 small marginal branches, a posterolateral branch and posterior  descending branch.  These vessels were free of significant disease.   Right coronary artery:  The right coronary artery was a nondominant  vessel that supplied 2 right ventricular branches.  Initially there was  damping and the injection showed spasm in the proximal right coronary  which resolved with nitroglycerin.  There was no significant  obstruction.   Left ventriculogram:  Left ventriculogram performed in the RAO  projection showed good wall  motion with no areas of hypokinesis.  Estimated ejection fraction was 60%.   The aortic pressure was 143/73 with a mean of 107.  The left ventricular  pressure was  143/4.   CONCLUSION:  Normal coronary angiography and left ventricular wall  motion.   RECOMMENDATIONS:  Reassurance.      Bruce Elvera Lennox Juanda Chance, MD, [facility]  Electronically Signed     BRB/MEDQ  D:  08/20/2007  T:  08/21/2007  Job:  [id number]   cc:   [facility]  Olene Craven, M.D.  Veverly Fells. Excell Seltzer, MD  Everardo Beals. Juanda Chance, MD, [facility]

## 2011-01-28 NOTE — H&P (Signed)
[name], [name] NO.:  [id number]   MEDICAL RECORD NO.:  [id number]          PATIENT TYPE:  INP   LOCATION:  0101                         FACILITY:  [facility]   PHYSICIAN:  Della Goo, M.D. DATE OF BIRTH:  [birth year]   DATE OF ADMISSION:  08/17/2007  DATE OF DISCHARGE:                              HISTORY & PHYSICAL   PRIMARY CARE PHYSICIAN:  Dr. Demetrios Isaacs. [place].   CHIEF COMPLAINT:  Chest pain.   HISTORY OF PRESENT ILLNESS:  This is a 65 year old female presenting to  the emergency department with complaints of severe sharp substernal  chest pain which started at 3:00 p.m. in the afternoon.  She reports  this pain has been worsening in intensity, rates the pain as being a  10/10 at the worst.  She describes having associated symptoms of  shortness of breath, nausea and lightheadedness with the discomfort.  The pain radiates into the jaw and into both upper extremities.  She  reports also having a headache associated with the pain.  She denies  having any syncope associated with her symptoms.  The patient had a  similar presentation October 21, 2006.  The patient denies having any  vomiting, diarrhea, fevers or chills.   PAST MEDICAL HISTORY:  1. Significant for an acute left brain stroke with residual right-      sided numbness.  2. Hyperlipidemia.  3. Previous pulmonary embolism 30 years ago.  4. Gastroesophageal reflux disease.  5. 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  6. Thyroid nodules.   PAST SURGICAL HISTORY:  1. History of a total abdominal hysterectomy with bilateral salpingo-      oophorectomy.  2. Tonsillectomy and adenoidectomy.  3. Appendectomy.  4. Cervical disk C5-C6 diskectomy and C6-C7 diskectomy with bilateral      foraminotomies and interbody fusion with allograft plating at C5-C7      October 01, 2004.  5. Also benign tumor removal from left shoulder.   MEDICATIONS INCLUDE:  1. Zocor 20 mg 1 p.o.  daily.  2. Aspirin 325 mg 1 p.o. daily.   Fish oil, flaxseed oil, vitamin E, vitamin B, and vitamin A.   ALLERGIES:  PHENERGAN WHICH CAUSED ITCHING.   SOCIAL HISTORY:  The patient is married, nonsmoker, nondrinker.   FAMILY HISTORY:  Positive for coronary artery disease in her mother who  also had hypertension and diabetes mellitus.  Positive for cancer.  Brother had renal cancer, and father had lung cancer and was a smoker.   REVIEW OF SYSTEMS:  Pertinent for as mentioned above.   PHYSICAL EXAMINATION FINDINGS:  This is an mildly overweight 65 year old  female in discomfort but no acute distress.  Her vital signs are  temperature 97.5, blood pressure 131/75 to 162/92, heart rate 80-87,  respirations 20 and O2 saturation is 98% on room air.  HEENT EXAMINATION:  Normocephalic, atraumatic.  Pupils equally round,  reactive to light.  Extraocular muscles are intact, funduscopic benign.  There is no scleral icterus.  Oropharynx is clear.  Upper denture  present.  NECK:  Supple, full range of motion, no  thyromegaly, adenopathy or  jugular venous distention.  Positive well-healed surgical scar present  mid neck.  CARDIOVASCULAR:  Regular rate and rhythm.  No murmurs, gallops or rubs.  LUNGS:  Clear to auscultation bilaterally.  ABDOMEN:  Positive bowel sounds, soft, nontender, nondistended.  No  hepatosplenomegaly.  No rebound, no guarding.  EXTREMITIES:  Without cyanosis, clubbing or edema.  NEUROLOGIC EXAMINATION:  Alert and oriented x3.  Cranial nerves intact.  The patient is able to move all 4 of her extremities.   LABORATORY STUDIES:  White blood cell count 7.9, hemoglobin 14,  hematocrit 39.4, platelets 202.  Sodium 142, potassium 3.5, chloride  104, bicarb 26, BUN 9, creatinine 0.70, glucose 102, lipase 19.  Cardiac  enzymes:  Myoglobin 58.4, CK-MB 1.6, troponin less than 0.05, D-dimer  less than 0.22.  Chest x-ray reveals no acute disease findings.  EKG  reveals a normal sinus  rhythm without acute ST-segment changes.   ASSESSMENT:  A 65 year old female being admitted with  1. Atypical chest pain.  2. Hyperlipidemia.  3. Elevated blood pressure secondary to pain.   PLAN:  1. The patient will be admitted to the telemetry area for cardiac      monitoring.  2. Cardiac enzymes will be performed.  3. The patient will be placed on nitro paste, oxygen and aspirin      therapy.  4. DVT and GI prophylaxis have been ordered.  5. Further evaluation will ensue pending results of her studies and      her clinical symptoms.      Della Goo, M.D.  Electronically Signed     HJ/MEDQ  D:  08/17/2007  T:  08/18/2007  Job:  [id number]   cc:   Olene Craven, M.D.  Fax: [phone]

## 2011-01-28 NOTE — Consult Note (Signed)
NAMEJOURNIEE, [name] NO.:  [id number]   MEDICAL RECORD NO.:  [id number]          PATIENT TYPE:  INP   LOCATION:  1408                         FACILITY:  [facility]   PHYSICIAN:  Veverly Fells. Excell Seltzer, MD  DATE OF BIRTH:  11-09-1945   DATE OF CONSULTATION:  08/18/2007  DATE OF DISCHARGE:                                 CONSULTATION   REASON FOR CONSULTATION:  Chest pain.   HISTORY OF PRESENT ILLNESS:  [name] is a very nice 65 year old  woman who presented to the hospital yesterday because of chest pain.  She was standing in her kitchen where she felt a ball in the center of  her chest radiating to both arms.  She felt flushed and developed  associated dyspnea.  She had pain radiating to both arms.  She has felt  generally weak since this occurred.  She had several recurrences of  chest pain last night, but they were fleeting in nature.  She has had no  radiation to the jaw or back.  She has not had exertional symptoms over  the last several weeks.  She has no prior history of chest pain or  cardiac disease.  She has no other complaints at this time.  She has  been chest pain free today.   PAST MEDICAL HISTORY INCLUDES:  1. A left brain stroke with residual right-sided numbness.  2. Hyperlipidemia.  3. Remote pulmonary embolism.  4. Gastroesophageal reflux disease.  5. Cervical spine discectomy.  6. Tonsillectomy and appendectomy.  7. Total abdominal hysterectomy with BSO.   SOCIAL HISTORY:  The patient is married and lives in [place].  She  does not smoke cigarettes or drink alcohol.  She is not employed.   FAMILY HISTORY:  Her mother had coronary artery disease, but was elderly  when this diagnosis was made.  She also had hypertension and diabetes.  Her father died of lung cancer.  She has had a brother with renal  cancer.   MEDICATIONS:  In the hospital include Protonix, Senokot, aspirin,  oxycodone, fish oil, flaxseed oil, and low-dose Lovenox.   ALLERGIES:  PHENERGAN and MORPHINE.   REVIEW OF SYSTEMS:  A 12-point review of systems was performed.  Pertinent positives included headache, weakness, and dizziness.  All  other systems were reviewed, and are negative except as detailed above.   PHYSICAL EXAM:  VITAL SIGNS:  The patient is alert and oriented.  She is  in no acute distress.  Temperature is 98, heart rate 79, respiratory  rate 20, blood pressure 97/55, oxygen saturation 98% on room air.  GENERAL: Patient is well-developed and well-nourished.  HEENT:  Normal.  NECK:  Normal carotid upstrokes without bruits.  Jugular venous pressure  normal.  No thyromegaly or thyroid nodules.  LUNGS:  Clear to auscultation bilaterally.  HEART:  Regular rate and rhythm without murmurs or gallops.  The apex is  discrete and nondisplaced.  There is no right ventricular heave or lift.  ABDOMEN:  Soft, nontender, no organomegaly.  No abdominal bruits.  No  masses, no rebound or guarding.  SKIN:  Warm  and dry without rash.  BACK:  There is no CVA tenderness.  EXTREMITIES:  No clubbing, cyanosis or edema.  Peripheral pulses are 2+  and equal throughout.  There are no femoral artery bruits.  NEUROLOGIC:  Cranial nerves II-XII are intact.  Strength is 5/5 and  equal in the arms and legs bilaterally.   CHEST X-RAY:  Shows no acute process.   EKG:  I only can find rhythm strips in the chart that show no ST change.  I cannot find the 12-lead EKG, this has been ordered.  Labs.  The back to the EKG of the EKG has been interpreted as showing no  acute changes I have not personally sorry has been interpreted as  showing no significant ST-segment changes personally seen this.   LABS:  Cardiac biomarkers are negative x3 sets.  D-dimer is not  detectable at less than 0.22, creatinine 0.7, glucose 102, potassium  3.5.  Coags are normal.  Total cholesterol 206, triglycerides 206, HDL  32 LDL 133.   ASSESSMENT:  1. This is a 65 year old woman with  chest pain.  She has both typical      and atypical features, but predominant pattern is atypical for      coronary artery disease.  2. Dyslipidemia.   I would favor an inpatient adenosine Myoview study.  This has been  ordered and we plan on performing this tomorrow.  A 12-lead EKG has been  reordered so that this can be reviewed.  The patient has ruled out for  myocardial infarction, and does not have high-risk features.  If her  stress study is normal at low risk it would be reasonable to discharge  her home.  Further plans pending the results of her stress test.      Veverly Fells. Excell Seltzer, MD  Electronically Signed     MDC/MEDQ  D:  08/18/2007  T:  08/19/2007  Job:  [id number]   cc:   Olene Craven, M.D.  Fax: [phone]

## 2011-01-28 NOTE — Discharge Summary (Signed)
[name], [name]:  [id number]   MEDICAL RECORD NO.:  [id number]          PATIENT TYPE:  INP   LOCATION:  2005                         FACILITY:  [facility]   PHYSICIAN:  Lonia Blood, M.D.       DATE OF BIRTH:  [birth year]   DATE OF ADMISSION:  08/17/2007  DATE OF DISCHARGE:  08/21/2007                               DISCHARGE SUMMARY   DISCHARGE DIAGNOSES:  1. Chest pain  2. Positive stress test with negative cardiac catheterization.  3. History of stroke.  4. Hyperlipidemia. .  5. Remote history of PE.  6. Esophageal reflux disease.  7. Degenerative disease of cervical spine.   DISCHARGE MEDICATIONS:  1. Aspirin 325 mg daily.  2. Fish oil 2 tablets daily.  3. Flax seed oil 2 tablets daily.  4. Multivitamin 1 tablet daily.  5. Simvastatin 20 mg at bedtime.  6. Prilosec OTC 20 mg daily.  7. Xanax 0.25 mg twice a day as needed for anxiety.   CONDITION ON DISCHARGE:  [name] was discharged in good condition.  She is instructed follow up her primary care physician as needed.   PROCEDURES DURING THIS ADMISSION:  1. August 19, 2007, the patient underwent a Myoview stress test which      had some moderate apical ischemia with reversibility.  2. August 20, 2007, cardiac catheterization by Dr. Charlies Constable which      was essentially completely within normal limits.   CONSULTATION:  [facility] saw the patient.   HISTORY AND PHYSICAL:  For admission history and physical refer to the  dictated H&P which was done August 17, 2007, by Dr. Della Goo.   HOSPITAL COURSE.:  #1 - CHEST PAIN.  [name] is a 65 year old woman  with multiple risk factors including hyperlipidemia, Previous stroke who  was admitted for chest pain.  She was seen in consultation by cardiology  group and the patient had a stress test.  The stress test was positive  for inducible ischemia in the apical PIC region.  For this reason, the  patient was taken to the  cardiac catheterization lab, and she had  cardiac catheterization which was completely within normal limits.  We  felt that the positive stress test was probably due to breast  attenuation.  [name] has remained chest pain free during this  hospitalization.  We feel that the patient chest pain could be related  to esophageal reflux, so we have instructed the patient to resume her  proton pump inhibitor.   #2 - HYPERLIPIDEMIA.  This hospitalization, the patient had a fasting  lipid panel with an LDL level of 133, triglycerides 206.  She was  instructed continue her fish oil and flax seed oil, and we have added  Zocor.   #3 - ANXIETY.  This has been discussed with the patient.  She will have  careful follow-up with her primary care physician.  Low dose  benzodiazepine has been instituted for now.      Lonia Blood, M.D.  Electronically Signed     SL/MEDQ  D:  08/21/2007  T:  08/22/2007  Job:  [id number]   cc:   [facility]  Olene Craven, M.D.

## 2011-01-28 NOTE — H&P (Signed)
[name], MABIE NO.:  [id number]   MEDICAL RECORD NO.:  [id number]          PATIENT TYPE:  INP   LOCATION:  0107                         FACILITY:  [facility]   PHYSICIAN:  Elliot Cousin, M.D.    DATE OF BIRTH:  [birth year]   DATE OF ADMISSION:  12/05/2007  DATE OF DISCHARGE:                              HISTORY & PHYSICAL   PRIMARY CARE PHYSICIAN:  Windle Guard, M.D.   CHIEF COMPLAINT:  Abdominal pain, nausea, and subjective fever and  chills.   HISTORY OF PRESENT ILLNESS:  The patient is a 65 year old woman with a  past medical history significant for diverticulosis, cerebrovascular  disease, and hyperlipidemia, who presents to the emergency department  with a chief complaint of abdominal pain.  Her pain started last night.  It is located at the left lower quadrant.  She describes the pain as  severe.  The pain took my breath away.  The pain has been constant  since last night.  It is not associated with diarrhea, constipation,  bright red blood per rectum or black tarry stools.  She has had nausea  but no vomiting.  She has had subjective fever and chills.  She has also  felt lightheaded and has had a frontal headache without a stiff neck or  photophobia.  Her last meal was approximately 3:00 p.m. yesterday.  She  has been afraid to eat because of the pain.  She has had no pain with  urination.  She has no prior history of diverticulitis.   During the evaluation in the emergency department, the patient is noted  to be afebrile and otherwise hemodynamically stable.  A CT scan of the  abdomen and pelvis was ordered by the emergency department physician,  and it reveals distal descending diverticulitis without abscess.  The  patient will, therefore, be admitted for further evaluation and  management.   PAST MEDICAL HISTORY:  1. History of a positive cardiac stress test but normal coronary      arteries per cardiac catheterization in December 2008 by Dr.  Charlies Constable.  2. 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 Hyperlipidemia.  4. History of PE, approximately 31 years ago.  5. Gastroesophageal reflux disease.  6. Diverticulosis.  7. Thyroid nodules.  8. Anxiety disorder.  9. Status post C5-C6 and C6-C7 diskectomies with bilateral      foraminectomy and interbody fusion with allograft plate at E3-M6      per Dr. Jeral Fruit on October 01, 2004.  10  Status post hysterectomy and bilateral salpingo-oophorectomy.  1. Status post T&A  2. Status post appendectomy.  3. Status post excision of benign tumor from the left shoulder in the      past.   MEDICATIONS:  1. Fish oil capsule once daily.  2. Aspirin 325 mg daily.  3. Mucinex 1 tablet twice daily  as needed.  4. Flaxseed oil 2 capsules once daily.   ALLERGIES:  The patient has an allergy to MORPHINE which causes  hives.  She also has an allergy to [facility] which causes itching.   SOCIAL HISTORY:  The patient is married.  [place]  She is a former Lawyer.  She is currently on  disability from her previous stroke and neck surgery.  She has four  children.  She denies tobacco, alcohol or illicit drug use.   FAMILY HISTORY:  Her mother has coronary artery disease,  history of  CABG at 64 years of age,  and diabetes mellitus.  Her father died of  lung cancer at 42 years of age.  Her brother died of kidney cancer.   REVIEW OF SYSTEMS:  Otherwise negative.   PHYSICAL EXAMINATION:  VITAL SIGNS:  Temperature 98.8, blood pressure  103/63, pulse 94, respiratory rate 16, oxygen saturation 97% on room  air.  GENERAL:  The patient is a pleasant, alert, 65 year old Caucasian woman  who is currently lying in bed in no acute distress.  However, she does  appear ill.  HEENT:  Head is normocephalic, atraumatic.  Pupils equal, round,  reactive to light.  Extraocular movements are intact.   Conjunctivae are  clear.  Sclerae are white. Nasal mucosa is dry.  Oropharynx reveals good  dentition.  Mucous membranes are mildly dry.  No posterior exudates or  erythema.  NECK:  Supple.  No adenopathy, no thyromegaly, no bruit or JVD.  LUNGS:  Clear to auscultation bilaterally.  HEART:  S1-S2 with no murmurs, rubs or gallops.  ABDOMEN:  Hypoactive bowel sounds, soft, moderately tender at the left  lower quadrant without any appreciable masses palpated.  No  hepatosplenomegaly.  No distention.  GU AND RECTAL:  Deferred.  EXTREMITIES:  Pedal pulses palpable bilaterally.  No pretibial edema and  no pedal edema.  NEUROLOGIC:  The patient is alert and oriented x3.  Cranial nerves II-  XII are intact.  Strength is 5/5 throughout.  Sensation is intact.   ADMISSION LABORATORY DATA:  Urinalysis is essentially negative.  WBC  12.4, hemoglobin 14.0, platelets 199.  Sodium 137, potassium 4.6  chloride 102, CO2 25, glucose 98, BUN 7, creatinine 0.84, total  bilirubin 1.4, alkaline phosphatase 89, SGOT 32, SGPT 23, total protein  7.6, albumin 4.0, calcium 9.6.   CT of the abdomen and pelvis:  No acute abdominal process.  Within the  pelvis, there is eccentric wall thickening in the distal descending  colon with multiple small adjacent diverticula.  There is moderate  adjacent inflammatory and edematous change in the pericolonic fat.  The  sigmoid colon and rectum are decompressed.  Urinary bladder  physiologically distended.  No free fluid.  Bilateral pelvic  phleboliths.   ASSESSMENT:  1. Abdominal pain, secondary to distal descending diverticulitis.  2. Leukocytosis.  The patient's white blood cell count is 12.4.  She      is afebrile, however.   PLAN:  1. The patient will be admitted for further evaluation and management.  2. Bowel rest.  3. Will start empiric Cipro and Flagyl intravenously.  4. Maintenance IV fluids.  5. Pain management with as-needed Dilaudid and antiemetic  treatment      with Zofran as needed.  6. Supportive care.      Elliot Cousin, M.D.  Electronically Signed     DF/MEDQ  D:  12/05/2007  T:  12/05/2007  Job:  [id number]   cc:   Windle Guard, M.D.  Fax: [phone]

## 2011-01-31 NOTE — Discharge Summary (Signed)
[name], [name]:  [id number]   MEDICAL RECORD NO.:  [id number]          PATIENT TYPE:  INP   LOCATION:  1430                         FACILITY:  [facility]   PHYSICIAN:  Marcellus Scott, MD     DATE OF BIRTH:  [birth year]   DATE OF ADMISSION:  10/21/2006  DATE OF DISCHARGE:  10/22/2006                               DISCHARGE SUMMARY   PRIMARY CARE PHYSICIAN:  Olene Craven, M.D.   DISCHARGE DIAGNOSES:  1. Atypical chest pain/abdominal pain.  2. History of recent cerebrovascular accident.  3. Dyslipidemia.  4. Anxiety disorder.  5. Thyroid nodule per CT of the neck in December 2007.  6. Gastroesophageal reflux disease.   DISCHARGE MEDICATIONS:  1. Aspirin 325 mg p.o. daily.  2. Protonix 40 mg p.o. daily.  3. Zocor 20 mg p.o. q.h.s.   PROCEDURES:  1. On October 21, 2006, a CT of the abdomen and pelvis with contrast.      CT of the abdomen impression:  1.  No acute finding in the abdomen      by CT.  2.  Bibasilar dependent atelectasis.  3.  No evidence of      acute pancreatitis by CT.  Pelvic CT is stable.  CT pelvis, no      acute finding.  2. On October 21, 2006, a chest x-ray, two-view, impression:  1.  Mild      chronic interstitial changes noted.  2.  No acute cardiopulmonary      disease.  3. Echocardiogram done on October 22, 2006.  The echo report      impression is overall LV systolic function was normal.  LV ejection      fraction 60%.  And the left atrium mildly dilated.  This is no      significant change compared to a previous study of September 14, 2006.   CONSULTATIONS:  None.   HOSPITAL COURSE AND PATIENT DISPOSITION:  For details of the initial  admission, please refer to the history and physical note that was done  by Dr. Elliot Cousin, on October 21, 2006.  In summary, [name] is a  pleasant 65 year old Caucasian female patient with a past medical  history of recent acute left brain stroke in December 2007,  dyslipidemia, generalized anxiety, gastroesophageal reflux disease,  thyroid nodules, who presented with chest pain.  Subsequently, she said  that actually the pain began in the epigastric region and radiated to  the retrosternal area.  She was admitted for further evaluation.   1. Chest pain/abdominal pain.  The patient was admitted to the      telemetry unit.  The patient had cardiac enzymes cycled x3, which      were negative.  A D-dimer was done which was also negative.      Echocardiogram was as indicated above.  For evaluation the patient      had a lipase done which was markedly elevated at close to 800. For      a questionable pancreatitis, a CT of the abdomen and pelvis was  performed which was negative. Unsure of the reason for this      elevated lipase because today's results are essentially normal,      question for lab error.  The patient is asymptomatic of any chest      pain or abdominal pain at this time.  Also possibility could be      gastroesophageal reflux disease.  The patient is tolerating p.o.      with no nausea, vomiting, abdominal pain at this time.  The patient      is stable for discharge to follow up with her primary care      physician.  She has been advised to seek immediate medical      attention if there is any recurrence of symptoms.  2. History of recent CVA.  To continue full dose aspirin.  3. Dyslipidemia.  She is to continue on her Zocor which might have to      be adjusted as an outpatient.  Her lipid panel showed a cholesterol      of 187, triglycerides of 272, HDL of 36, LDL of 97, and VLDL of 54.  4. Anxiety disorder on no medications at this time to be evaluated as      deemed necessary as an outpatient.  5. Thyroid nodules to be followed up as indicated.  6. Gastroesophageal reflux disease for p.o. Protonix.      Marcellus Scott, MD  Electronically Signed     AH/MEDQ  D:  10/22/2006  T:  10/22/2006  Job:  [id number]   cc:   Olene Craven, M.D.  Fax: [phone]

## 2011-01-31 NOTE — H&P (Signed)
[name], BRANCA NO.:  [id number]   MEDICAL RECORD NO.:  [id number]          PATIENT TYPE:  EMS   LOCATION:  MAJO                         FACILITY:  [facility]   PHYSICIAN:  Mobolaji B. Bakare, M.D.DATE OF BIRTH:  1946-04-30   DATE OF ADMISSION:  09/14/2004  DATE OF DISCHARGE:                                HISTORY & PHYSICAL   PRIMARY CARE PHYSICIAN:  Dr. Demetrios Isaacs. [place].   CHIEF COMPLAINT:  Dizziness that started yesterday morning.   HISTORY OF PRESENTING COMPLAINT:  [name] is a 65 year old Caucasian  female with a history of hyperlipidemia, anxiety, presenting with a feeling  of dizziness after she came back from work yesterday morning.  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  She had tried to go to bed, but  she was feeling palpitations, tingling and numbness in both feet and hands.  At the time of the palpitations, she also experienced some discomfort on the  left side of her neck.  Again, last evening prior to getting ready for work,  she experienced similar symptoms, and asked her husband to call EMS.  She  did not pass out.   The patient has a history of anxiety for which she was started on Xanax.  Her son is presently in [place].  However, he is doing well, and he gave he a  call four days ago.   On arrival in the emergency department, EKG showed sinus tachycardia with a  heart rate of 107, no ST-segment changes, although she does have some  insignificant Q waves in II, III and aVF.  Two cardiac enzymes at point of  care remains negative.  She was given Lopressor 25 mg p.o. and morphine IV,  and was started on nitroglycerin infusion.  Currently, she is chest pain  free, and heart rate is 72 beats per minute.   REVIEW OF SYSTEMS:  Negative for nausea, vomiting.  No shortness of breath.  No headaches.  No fever.  No chills. No abdominal pain.  No orthopnea or  PND.   PAST MEDICAL HISTORY:  1.  Hyperlipidemia for which she  started taking fish oil.  2.  Anxiety.   MEDICATIONS:  1.  Fish oil.  2.  Xanax.   ALLERGIES:  PHENERGAN.  She gets agitation.   FAMILY HISTORY:  Mother had bypass surgery at the age of 46 years.  Brother  passed away from kidney cancer.  She is married and has kids.   SOCIAL HISTORY:  She never smoked cigarettes.  She does not drink alcohol.  She lives with her husband and currently works as a Lawyer at [employer].   PHYSICAL EXAMINATION:  VITAL SIGNS:  Temperature 97.7, blood pressure  159/57.  Pulse 112.  Respiratory rate 16.  O2 saturations 98.%.  GENERAL:  The patient is currently comfortable.  Not in respiratory  distress.  HEENT:  Normocephalic, atraumatic head.  Pupils equal, round and reactive to  light.  Extraocular muscle movements intact.  Normocephalic, atraumatic  head.  NECK:  No carotid bruits, no elevated jugular venous distension.  Mucous  membranes are moist.  LUNGS:  Clear clinically to auscultation.  CVA:  S1, S2, regular, no murmur, no gallop, no rub.  ABDOMEN:  Not distended, soft, nontender.  No hepatosplenomegaly.  EXTREMITIES:  No pedal edema, no calf tenderness.  CNS:  Cranial nerves intact. No focal neurological deficits.  SKIN:  No rash, no petechiae.   LABORATORY DATA:  Initial laboratory data:  Cardiac enzymes at the point of  care within normal.  White cells 6.4, hemoglobin 13, hematocrit 36.6, MCV  84.8.  Platelets 201,000.  Sodium 141, potassium 3.8, chloride 109,  bicarbonate 25, glucose 104, BUN 11, creatinine 0.6, calcium 5.4.  PTT 28,  PT 12.2, INR 0.9.   EKG:  As described above.   ASSESSMENT/PLAN:  Ms. Petitfrere is a 65 year old white female with  cardiovascular risk factors of hyperlipidemia and possibly family history,  presenting with atypical chest pain, accompanied with palpitations,  tachycardia, and diaphoresis.   Problem #1.  Chest pain, stroke, chest pressure.  This is most likely  anxiety related. However, in view of the  patient's risk factors, we will  admit to rule out cardiac ischemia, admit to telemetry, serial cardiac  enzymes x2.  Continue metoprolol 12.5 mg p.o. b.i.d., aspirin 325 mg p.o.  daily.  We will continue with nitroglycerin infusion as already started by  ED until current bottle is finished, and will be titrated off if the patient  remains chest pain free.   Problem #2.  Anxiety.  We will continue Xanax 0.5 mg p.o. q.4h. p.r.n.   Problem #3.  Sinus tachycardia, currently related to the above.  We will  check TSH.   Problem #4.  Hyperlipidemia.  Obtain fasting blood lipid profile.   Problem #5.  Deep vein thrombosis prophylaxis.  We will use Lovenox 40 mg  subcu daily.      Mobo   MBB/MEDQ  D:  09/15/2004  T:  09/15/2004  Job:  [id number]   cc:   Olene Craven, M.D.  [address]  [address]  [place]  [place] [address]  Fax: [phone]

## 2011-01-31 NOTE — Consult Note (Signed)
[name], AUBLE NO.:  [id number]   MEDICAL RECORD NO.:  [id number]          PATIENT TYPE:  INP   LOCATION:  3733                         FACILITY:  [facility]   PHYSICIAN:  Darden Palmer., M.D.DATE OF BIRTH:  20-Jan-1946   DATE OF CONSULTATION:  09/17/2004  DATE OF DISCHARGE:                                   CONSULTATION   HISTORY:  A 65 year old female who was seen at the request of the [facility]  hospitalist with complaints of dizziness and atypical chest pain.  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  She  developed a rapid heartbeat, tingling and numbness in both feet and hands.  She also had some mild discomfort in the left side of her neck.  Prior to  getting for work, she again experienced similar symptoms accompanied with  dyspnea and was somewhat tachycardic.  She called EMS and was brought to the  [facility] Emergency Room.  She has a prior history of anxiety.  This has  been worse because  her son is on duty in [place].  She was found to be in  sinus tachycardia and initial point of enzymes were unremarkable.   PAST MEDICAL HISTORY:  Remarkable for hyperlipidemia, as well as anxiety.  There is no history of hypertension or diabetes.   PAST SURGICAL HISTORY:  No previous surgery.   ALLERGIES:  PHENERGAN.   MEDICATIONS PRIOR TO ADMISSION:  Xanax and fish oil.   FAMILY HISTORY:  Mother had bypass surgery in her 21s.  A brother died of  kidney cancer.   SOCIAL HISTORY:  She has never smoked cigarettes.  She does not use alcohol.  She lives with her husband.  She currently works as an Lawyer at [employer].   REVIEW OF SYSTEMS:  She has had some neck pain and has cervical spine films.  She has increased lipids for which she has taken fish oil previously.  She  has some chronic anxiety and has been placed on Xanax for this previously.  She has insomnia and difficulty sleeping.  She has occasional  mild  arthritis, particularly involving her neck.  She has had mild obesity.  She  does not have exertional chest pressure or heaviness and does not have  dyspepsia or genitourinary symptoms.  Other than as noted above, the  remainder of the review of systems is unremarkable.   PHYSICAL EXAMINATION:  GENERAL APPEARANCE:  She is a pleasant female  appearing stated age.  She is alert, in no acute distress and appears  somewhat anxious.  VITAL SIGNS:  The blood pressure is currently 100/60.  SKIN:  Warm and dry.  HEENT:  EOMI.  PERRLA.  C&S clear.  Fundi not examined.  The pharynx is  negative.  NECK:  Supple without masses, JVD, thyromegaly or bruits.  LUNGS:  Clear to A&P.  CARDIOVASCULAR:  Normal S1 and S2.  No S3, S4 or murmur.  ABDOMEN:  Soft and nontender.  No masses, hepatosplenomegaly or aneurysm.  EXTREMITIES:  Femoral and distal pulses are  2+.   LABORATORY DATA:  The 12-lead EKG is normal.  The laboratory data on  admission showed a normal CBC.  The chemistry panel was normal, except for a  glucose of 104.  The cholesterol was 251 with an HDL of 45, triglycerides of  250 and an LDL cholesterol of 154.   IMPRESSION:  1.  Atypical chest pain that does not sound typical for myocardial ischemia.  2.  History of palpitations, numbness and tingling that may be secondary to      anxiety.  3.  Anxiety disorder.  4.  Combined hyperlipidemia.   RECOMMENDATIONS:  I believe the patient should have a stress Cardiolite  study I think because of her family history and she will likely need  treatment for her lipids if they do not come under control with diet  therapy.   Thank you for asking me to see her with you.  I will plan to do a stress  Cardiolite on her later today.      Kristine Royal   WST/MEDQ  D:  09/17/2004  T:  09/17/2004  Job:  [id number]   cc:   Olene Craven, M.D.  [address]  [address]  [place]  [place] [address]  Fax: [phone]

## 2011-01-31 NOTE — Consult Note (Signed)
[name], [name] NO.:  [id number]   MEDICAL RECORD NO.:  [id number]          PATIENT TYPE:  INP   LOCATION:  3733                         FACILITY:  [facility]   PHYSICIAN:  Hilda Lias, M.D.   DATE OF BIRTH:  Feb 13, 1946   DATE OF CONSULTATION:  09/19/2004  DATE OF DISCHARGE:                                   CONSULTATION   REFERRING SERVICE:  [facility].   REASON FOR CONSULTATION:  [name] is a lady who was admitted to the  hospital because of dizziness, tingling sensation involving both hands,  chest pressure and sinus tachycardia.  The patient was admitted to the  cardiac floor and the patient had workup to rule out the possibility of MI.  This lady was seen by me on July 25, 2004 with a complaint of pain in  the neck with radiation to the shoulder, going to the left upper extremity.  At that time, the MRI showed only spondylosis at the level of 4-5 and 6-7 to  the left, but it was not quite really helpful.  I did a treatment on her.  She was aware that if she were not to be better, she was to call my office  to set up a cervical myelogram.  Today, she is complaining of a lot of pain  in the neck, going to the shoulder and dysesthesias of both hands, left  worse than the right one.   PHYSICAL EXAMINATION:  NEUROLOGIC:  Clinically, it is difficult to find any  weakness, but it seems there is some mild weakness of the left triceps.  She  has decreased flexibility of the cervical spine with pain with  lateralization and bending.  Reflexes are symmetrical and sensation is  grossly normal, though she complaints of a tingling sensation in the left  hand.   ASSESSMENT AND PLAN:  They discontinued the ASA and Lovenox, and she is  being scheduled for cervical myelogram tomorrow if the PT and PTT are within  normal limits.  Once I have the cervical myelogram, which was outpatient-  advised, we will go ahead and see what we can do in relation to her  neck.       EB/MEDQ  D:  09/19/2004  T:  09/19/2004  Job:  [id number]

## 2011-01-31 NOTE — Discharge Summary (Signed)
[name], [name] NO.:  [id number]   MEDICAL RECORD NO.:  [id number]          PATIENT TYPE:  INP   LOCATION:  3733                         FACILITY:  [facility]   PHYSICIAN:  Isidor Holts, M.D.  DATE OF BIRTH:  [birth year]   DATE OF ADMISSION:  09/14/2004  DATE OF DISCHARGE:  09/20/2004                                 DISCHARGE SUMMARY   PRIMARY CARE PHYSICIAN:  Olene Craven, M.D.   DISCHARGE DIAGNOSES:  1.  Cervical radiculopathy.  2.  Dyslipidemia.  3.  Anxiety.  4.  Atypical chest pain, status post normal stress Cardiolite September 17, 2004.   DISCHARGE MEDICATIONS:  1.  Zocor 20 mg p.o. q.h.s.  2.  Toprol 12.5 mg p.o. b.i.d.  3.  Gabapentin 100 mg p.o. t.i.d.  4.  Continue all preadmission medications.   PROCEDURE:  1.  Chest x-ray dated September 14, 2004 which showed no evidence of acute      disease.  2.  X-ray of cervical spine dated September 16, 2004.  This showed mild      narrowing of the C7-T1 and C5-C6 interspaces, normal alignment, negative      for fracture or other acute bony injury.  Asymmetric facet hypertrophy      is suggested at C3 to C6, i.e. degenerative changes.  3.  Status post myocardial perfusion scan/stress Cardiolite September 17, 2004      showing no evidence of infarction or ischemia.  Estimated ejection      fraction is 66%.   CONSULTATIONS:  1.  W. Viann Fish, M.D., cardiology.  2.  Hilda Lias, M.D., neurosurgery.   HISTORY OF PRESENT ILLNESS:  Admission history, as in history and physical  examination notes of September 15, 2004, however, in brief this is a 65-year-  old Caucasian female with known history of dyslipidemia and anxiety who  presented with dizziness, palpitations, tingling and numbness in both feet  and hands as well as discomfort on the left side of her neck.  On arrival at  the emergency department the patient had a mild sinus tachycardia with no ST  segment changes, although she had  insignificant Q waves in inferior leads  II, III and aVF.  Cardiac enzymes were within normal limits.  A diagnosis of  atypical chest pain was made and she was admitted for further evaluation,  investigation and management and commenced on nitroglycerin infusion, beta  blocker and morphine.   HOSPITAL COURSE:  PROBLEM #1: ATYPICAL-SOUNDING CHEST PAIN:  The patient  presented with symptomatology which was atypical for coronary artery  disease, however, it was felt that she does indeed have risk factors for  coronary artery disease including age, dyslipidemia and possible family  history of coronary artery disease.  Cardiology consultation was therefore  requested and this was kindly provided by Dr. Donnie Aho.  The patient underwent  Cardiolite stress test on September 17, 2004.  This was negative without any  evidence of coronary ischemia.  Nitrates and morphine were therefore  discontinued.   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  With this in mind as the cause of the patient's  symptomatology, neurosurgical consultation was requested and this was kindly  provided by Dr. Hilda Lias who evaluated the patient on September 19, 2004  and felt that the patient could benefit from cervical myelogram to define  cervical spine anatomy and determine whether or not surgical intervention  would be appropriate.  This was originally scheduled to be done on September 20, 2004 but had to be deferred because the patient had been given some Xanax  the night before secondary to anxiety symptoms. Apparently the cervical  myelogram protocol precludes Xanax or SSRI within 48 hours of procedure.  There was no escalation, however, in  the patient's symptoms; as a matter of  fact, she remains clinically stable and relatively asymptomatic at this  time.   PROBLEM #3:  ANXIETY:  The patient is clearly an anxious individual and  apparently has a son who is in [place] at the moment, further adding to the  stress which she appears to be under.  During the course of her hospital  stay she was satisfactorily managed with Xanax and SSRI.   PROBLEM #4:  DYSLIPIDEMIA:  This was managed with Zocor.   DISPOSITION:  The patient remained in stable condition clinically and as of  September 20, 2004 she was discharged in satisfactory condition.   ACTIVITY:  As tolerated.   DIET:  Dietary restrictions include low cholesterol diet.   FOLLOW UP:  Patient has an appointment for follow up with Dr. Hilda Lias  for her cervical myelogram and definitive treatment if indicated.  She has  been supplied Dr. Cassandria Santee phone number, i.e. [phone] although Dr.  Jeral Fruit has assured me that he will call the patient and set up an  appointment for her cervical myelogram.  The patient is also recommended to  follow up with her primary physician, Dr. Kern Reap within two weeks.  She is to call for an appointment and has verbalized understanding.      Chri   CO/MEDQ  D:  09/26/2004  T:  09/26/2004  Job:  [id number]   cc:   Olene Craven, M.D.  [address]  [address]  [place]  [place] [address]  Fax: [phone]   Hilda Lias, M.D.  [address]  [place], [place] [address]  Fax: [phone]

## 2011-01-31 NOTE — Consult Note (Signed)
NAMEDACIE, MANDEL NO.:  [id number]   MEDICAL RECORD NO.:  [id number]          PATIENT TYPE:  INP   LOCATION:  1433                         FACILITY:  [facility]   PHYSICIAN:  Bevelyn Buckles. Champey, M.D.DATE OF BIRTH:  07/11/1946   DATE OF CONSULTATION:  DATE OF DISCHARGE:                                 CONSULTATION   REQUESTING PHYSICIAN:  Dr. Ethelda Chick   REASON FOR CONSULTATION:  Stroke/TIA.   HISTORY OF PRESENT ILLNESS:  Ms. Glassco is a 65 year old Caucasian  female with no significant past medical history who presents with a four-  day history of severe headaches and right-sided tingling, numbness.  The  patient noticed onset suddenly with bilateral posterior neck and  occipital headache which was described as pounding 10/10 without any  radiation four days ago.  This lasted transiently and was followed by  bilateral frontal headache ever since which is described as dull and  achy with occasional nausea.  Since the onset of the headache, the  patient has had some right upper and lower extremity pins and needles  and tingling feeling/sensation and her right lower extremity feels weak.  This has been stable with no improvement since the onset.  The patient  has also been complaining of some blurry vision with her headache as  well.  She denies any speech changes, swallowing problems, chewing  problems, dizziness, vertigo, balance problems, falls or loss of  consciousness.   PAST MEDICAL HISTORY:  Positive for right carpal tunnel syndrome and  lower back pain.   CURRENT MEDICATIONS:  Include Vicodin p.r.n.   ALLERGIES:  PHENERGAN.   FAMILY HISTORY:  Positive for heart disease and lung cancer.   SOCIAL HISTORY:  The patient lives with her husband.  Denies any  tobacco, alcohol or drug use.   REVIEW OF SYSTEMS:  Positive as per HPI.  Review of systems negative as  per HPI in greater than 8 other organ systems.   EXAMINATION:  VITALS:  Temperature is  98.1, blood pressure is 141/93,  pulse is 93, respirations 24, O2 sat is 96% on room air.  HEENT:  Normocephalic, atraumatic.  Extraocular muscles intact.  Pupils  equal, round and react to light.  Neck is supple.  No carotid bruits.  HEART:  Is regular.  LUNGS:  Clear.  ABDOMEN:  Soft, nontender.  EXTREMITIES:  Show good pulses.  NEUROLOGICAL EXAMINATION:  The patient is awake, alert and oriented.  Language is fluent.  Memory and knowledge are within normal limits.  Cranial nerves II-XII are grossly intact.  Motor examination shows 4+ to  5-/5 in the right upper and lower extremity with some giveaway weakness.  Left upper and lower extremity is 5/5 strength.  The patient has normal  tone and no drift noted.  Sensory examination:  The patient states she  has decreased sensation on the right to light touch and pinprick when  compared to the left.  Reflexes are 1-2+ throughout.  Toes are neutral  bilaterally.  Cerebellar function is within normal limits to finger-to-  nose.  Gait is not assessed secondary to safety.  CT head showed no  acute infarct with some old small vessel disease in the vertex and some  mild atrophy. No bleeding.   LABS:  WBC 7.0, hemoglobin 13.0, hematocrit 37.5, platelets 210.   IMPRESSION:  This is a 65 year old with a four-day history of severe  headaches, right-sided numbness, tingling.  The patient could possibly  have a small vessel ischemic stroke versus a migraine variant versus  anxiety induced.  I would recommend getting an MRI/MRA of the brain and  neck and also I would recommend starting an aspirin 81 mg per day given  the small-vessel disease seen on her CT scan.  If there is positive  stroke MRI, I would recommend getting carotid Dopplers and 2-D  echocardiogram for completeness.  I would also recommend checking lipids  and homocysteine level in the morning.  We will follow the patient as  consultants.      Bevelyn Buckles. Nash Shearer, M.D.   Electronically Signed     DRC/MEDQ  D:  09/11/2006  T:  09/12/2006  Job:  [id number]

## 2011-01-31 NOTE — Op Note (Signed)
[name], [name] NO.:  [id number]   MEDICAL RECORD NO.:  [id number]          PATIENT TYPE:  INP   LOCATION:  2864                         FACILITY:  [facility]   PHYSICIAN:  Hilda Lias, M.D.   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  Incidental fat  tissue at level of C3-4 on the left.   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  Incidental fat  tissue at level of C3-4 on the left.   OPERATION PERFORMED:  Anterior 5-6, 6-7 diskectomy, decompression of the  spinal cord, bilateral foraminotomy, interbody fusion with allograft plate  from C5 to C7.  Microscope.   SURGEON:  Hilda Lias, M.D.   ASSISTANT:  Danae Orleans. Venetia Maxon, M.D.   ANESTHESIA:  General.   INDICATIONS FOR PROCEDURE:  [name] is a lady who was seen by me in my  office back in November.  At that time we made a diagnosis of cervical  spondylosis.  Nevertheless the MRI was not quite helpful.  The patient came  to the emergency room complaining of chest pain and left arm pain and she  was admitted to the cardiology service.  She has completed cardiological  work-up which was negative.  She was discharged with an outpatient myelogram  which showed spondylosis at 5-6, 6-7 with a small fat tissue at level of 3-4  on the left side.  The patient wanted to proceed with surgery because the  pain was getting worse.  The risks were explained in the history and  physical.   DESCRIPTION OF PROCEDURE:  The patient was taken to the operating room and  after intubation, the neck was prepped with Betadine.  An incision was made  in the left side through the skin and subcutaneous tissue, platysma down to  cervical spine.  X-rays showed that we were at the level 5-6.  From then  on  we brought the microscope into the area.  We opened the anterior ligament at  5-6 and 6-7.  Total diskectomy was achieved.  Then with the 1 and 2 mm  Kerrison punches we opened the posterior ligament and we did foraminotomy  bilaterally.  It was more stenotic on the left than on the right side.  Nevertheless we found out that she has quite a bit of osteoporosis and when  the end plate was drilled, she had quite a bit of bleeding from the bone.  Because that we used Avitene to stop that bleeding.  After everything was  absolutely dry, the area was irrigated.  Two pieces of bone graft of 7 mm  were  inserted.  This was followed by a plate using five screws.  X-ray of the  spine showed good position of the graft and the plate.  From then on the  area was irrigated.  We waited about 10 minutes just to be  sure that there  was no evidence of bleeding.  When we found that there was no bleeding, we  closed the wound with Vicryl and Steri-Strips.       EB/MEDQ  D:  10/01/2004  T:  10/01/2004  Job:  [id number]

## 2011-01-31 NOTE — H&P (Signed)
[name], [name] NO.:  [id number]   MEDICAL RECORD NO.:  [id number]          PATIENT TYPE:  INP   LOCATION:  NA                           FACILITY:  [facility]   PHYSICIAN:  Hilda Lias, M.D.   DATE OF BIRTH:  October 09, 1945   DATE OF ADMISSION:  10/01/2004  DATE OF DISCHARGE:                                HISTORY & PHYSICAL   HISTORY OF PRESENT ILLNESS:  Ms. Haworth is a lady who was admitted to the  hospital because of neck and left upper extremity pain.  The patient had a  complete cardiovascular workup which was negative.  About 2 months ago, I  saw her in my office and at that we made diagnosis of cervical  radiculopathy.  We proceeded with outpatient cervical myelogram and because  of the findings, she wants to proceed with surgery.  The pain is mostly  localized to the neck with radiation to left shoulder.  She also complained  of some weakness.  She is miserable.  She cannot sleep.   PAST MEDICAL HISTORY:  Hysterectomy.   ALLERGIES:  PHENERGAN.   SOCIAL HISTORY:  Negative.   FAMILY HISTORY:  Negative.   REVIEW OF SYSTEMS:  MUSCULOSKELETAL:  Positive for neck pain, left upper  extremity pain.  HEENT:  Ringing in the ear.   PHYSICAL EXAMINATION:  HEENT:  Normal.  NECK:  She is able to flex by extension.  LUNGS:  Clear.  HEART:  Sounds normal.  EXTREMITIES;  Normal pulses.  NEUROLOGIC:  She has weakness of the left biceps and triceps.  Reflexes are  symmetrical.  Deltoids normal.   LABORATORY DATA AND X-RAY FINDINGS:  The cervical myelogram with question of  herniated disc at L3-L4 on the left side, but I talked to Dr. Benard Rink, who  read the MRI and the myelogram and CT scan post myelogram, and felt that it  was mostly fat and no evidence of herniated disc.  The x-ray showed  spondylosis worse at C5-C6, C6-C7.   IMPRESSION:  C5-C6 spondylosis.   PLAN:  The patient will be admitted for surgery.  The procedure will be a  two-level cervical  diskectomy at C5-C6 and C6-C7 with fusion bone graft and  plate.  She knows the risks with infection, CSF leak, worsening pain which  may require posterior decompression.  She will proceed with surgery.       EB/MEDQ  D:  10/01/2004  T:  10/01/2004  Job:  [id number]

## 2011-01-31 NOTE — Discharge Summary (Signed)
NAMEJAIDALYN, SCHILLO NO.:  [id number]   MEDICAL RECORD NO.:  [id number]          PATIENT TYPE:  INP   LOCATION:  1433                         [facility]ACILITY:  [facility]   PHYSICIAN:  Lonia Blood, M.D.      DATE OF BIRTH:  04/02/1946   DATE OF ADMISSION:  09/11/2006  DATE OF DISCHARGE:                               DISCHARGE SUMMARY   PRIMARY CARE PHYSICIAN:  Olene Craven, M.D.   DISCHARGE DIAGNOSES:  1. Right sided numbness and headache, most likely acute      cerebrovascular accident versus complex migraine, now resolved.  2. Dyslipidemia.  3. Degenerative joint disease.  4. Gastroesophageal reflux disease.   DISCHARGE MEDICATIONS:  1. Zocor 20 mg daily.  2. Aspirin 325 mg daily.  3. Vicodin 5/500 one tablet every 6 hours as needed for pain.   DISPOSITION:  The patient is currently back to her baseline.  All of her  symptoms have resolved.  She will follow up with Bevelyn Buckles. Nash Shearer, M.D.  of [facility].  A 2D echocardiogram has been  performed and preliminary results showed that it is okay, but a formal  result is still being awaited.   PROCEDURES PERFORMED:  1. Head CT scan without contrast on 09/11/2006 that showed no CT      evidence of acute intracranial abnormality.  Age indeterminate      cerebral atrophy with possible small vessel disease near the      vertex.  2. MRI/MRA of the brain on 09/12/2006 showed 2-3 mm focus of      restricted diffusion in the left periventricular white matter,      which could represent acute white matter infarct.  Otherwise,      negative.  Also, MRA was otherwise negative.  3. A follow up CT scan of the neck on 09/12/2006 showed no evidence of      adenopathy or mass lesions in the neck.  Status post cervical      fusion.  Two tiny nodules in the right upper lobe of the thyroid.      A follow up CT scan of the chest without contrast in 6-12 months is      recommended.  There are two tiny nodules in  the right upper lobe of      the lung.  Again, follow up chest CT scan in 6-12 months was      recommended.  4. Carotid Doppler performed on 09/14/2006 again was negative.  5. 2D echocardiogram performed on 09/14/2006 is still pending.   CONSULTATIONS:  Bevelyn Buckles. Nash Shearer, M.D., [facility].   BRIEF HISTORY AND PHYSICAL:  Please refer to dictated history and  physical by Isidor Holts, M.D.  In short, however, the patient is a  65 year old who presented with neck pain, headache, as well as right  sided weakness and paraesthesia's for about 4 days prior to coming in.  The patient has a history of degenerative joint disease and  radiculopathy.  In the emergency department, she was found to be with  normal vital signs.  She,  however, had diminished power in the right  upper and lower extremities.  The left upper and lower extremities were  fine.  Otherwise, no other focal neurologic deficits.  The patient  continued to have numbness.  Subsequently, she was admitted for further  workup.   HOSPITAL COURSE:  1. Right sided numbness and weakness.  This seems to be a result of      cerebrovascular accident, but complex migraine once she arrived.      Neurology was consulted and workup included checking numerous      causes, including cerebrovascular accident.  Her homocystine level      was found to be normal at 4.7.  Lipid profile showed her total      cholesterol of 213, triglycerides 298, HDL 38 and LDL 115.  Her TSH      was also normal.  Tests included MRI as above that confirmed some      deep white matter illness.  Also, Carotid Doppler was negative and      2D echocardiogram so far has been negative.  With this in mind, the      patient was started on aspirin, since she has not been on anything      before.  If she has any episodes in the future, then agreements      were that medications would be indicated.  2. Dyslipidemia.  The patient initially refused  getting on medication,      but currently has been on statin.  She is now on Zocor which will      be continued as an outpatient.  3. Degenerative joint disease.  The patient has been taking pain      medications at home.  Here, there was no problem, except that due      to the nature of her pain she had a negative CT scan that did not      show anything new.  4. Gastroesophageal reflux disease.  She has been started on Protonix      in the hospital, which she will continue to take as an outpatient.      Otherwise, the patient is stable and good for discharge.      Lonia Blood, M.D.  Electronically Signed     LG/MEDQ  D:  09/15/2006  T:  09/15/2006  Job:  [id number]   cc:   Bevelyn Buckles. Nash Shearer, M.D.  Fax: [phone]

## 2011-01-31 NOTE — H&P (Signed)
[name], [name] NO.:  [id number]   MEDICAL RECORD NO.:  [id number]          PATIENT TYPE:  INP   LOCATION:  1433                         FACILITY:  [facility]   PHYSICIAN:  Isidor Holts, M.D.  DATE OF BIRTH:  November 24, 1945   DATE OF ADMISSION:  09/11/2006  DATE OF DISCHARGE:                              HISTORY & PHYSICAL   PRIMARY MEDICAL DOCTOR:  Dr.  Barbee Shropshire.   CHIEF COMPLAINT:  Neck pain and headache, associated with right-sided  weakness and paresthesia for approximately 4 days.   HISTORY OF PRESENT ILLNESS:  This is a 65 year old female.  For past  medical history, see below.  According to the patient, approximately 4  days ago, she had gone to the store to make some purchases.  As she got  out of the car, she felt sudden pain both sides of her neck, which  appeared to radiate into both ears and this was associated with  transient increased acuity of her hearing bilaterally.  This lasted  approximately 4 minutes, and then was followed by frontal headache.  The  pain continues to be exacerbated by movements and she does experience  some dizziness on bending over.  In addition, she developed right arm  and leg paresthesia i.e. pins and needles as well as diminished power  in the affected extremities.  She states that she has mild blurring of  vision, but no swallowing difficulties and no speech difficulties.  As  her symptoms did not appear to be resolving,  she came to the emergency  department.   PAST MEDICAL HISTORY:  1. Dyslipidemia.  2. Anxiety.  3. History of cervical spine DJD with radiculopathy, status post C5-      C6/C6-C7 diskectomy bilateral foraminotomy and interbody fusion      with allograft plate Z6-X0 by Dr. Jeral Fruit on October 01, 2004.   MEDICATION HISTORY:  1. Vicodin p.r.n.  2. Fish oil 3 pills daily.  3. Flaxseed oil 2 capsules daily.   ALLERGIES:  PHENERGAN, this causes agitation.   REVIEW OF SYSTEMS:  As per HPI and chief  complaint, otherwise negative.  The patient denies abdominal pain or vomiting, diarrhea, although she  has bilateral upper quadrant soreness.  Denies shortness of breath or  chest pain.   SOCIAL HISTORY:  The patient is a CNA.  She quit working after her  surgery in January 2006, and is currently pursuing disability.  She is a  nonsmoker, nondrinker.  There is no history of drug abuse.  She is  married.   FAMILY HISTORY:  This is positive for coronary artery disease in mother  who had CABG at the age of 21 years.  The patient's brother passed away  from kidney cancer.   FAMILY HISTORY:  Otherwise noncontributory.   PHYSICAL EXAMINATION:  VITALS:  Temperature 98.1, pulse 93 per minute  regular, respiratory rate 24, BP 141/93 mmHg, pulse oximeter 96% on room  air.  The patient appears quite alert, comfortable, communicative, not  short of breath at first and not in obvious acute discomfort.  HEENT:  No clinical pallor or  jaundice.  No conjunctival injection.  Neck is supple.  JVP not seen.  No palpable lymphadenopathy.  No  palpable goiter.  CHEST:  Clinically clear to auscultation.  No wheezes, no crackles.  HEART:  Sounds 1-2 heard, normal, regular, no murmurs.  ABDOMEN:  Full, soft.  The patient has mild discomfort upper abdomen.  No guarding, no rebound.  Bowel sounds are normal.  EXTREMITIES:  Lower extremity examination shows no pitting edema.  Palpable peripheral pulses.  MUSCULOSKELETAL SYSTEM:  Unremarkable.  CENTRAL NERVOUS SYSTEM: The patient has diminished power right upper and  lower extremities of the order of 4-/5.  Left upper and lower extremity  power is 5/5.  No other focal neurologic deficit is as stated.   INVESTIGATIONS:  CBC:  WBC 7.0, hemoglobin 13.0, hematocrit 37.5,  platelets 210.  Electrolytes pending.  Head CT scan dated September 11, 2006 shows no acute intracranial findings.  There was atrophy and  possible small vessel disease.   ASSESSMENT AND  PLAN:  1. Right-sided weakness/paresthesia:  This is of approximately four      days duration now, effectively ruling out transient ischemic      attack.  The main consideration is the possibility of a      cerebrovascular accident.  Head CT scan was negative and      appropriate study will be MRI; however, we will need to discuss      with radiologist first, as this may be contraindicated in view of      the fact that the patient had neck surgery on January 2006 and has      hardware in situ.  It is also important to rule out possible      cervical myelopathy.  We will therefore recheck CT scan of the      cervical spine.  The patient has been seen by Dr. Nash Shearer,      neurologist, who recommended CVA workup.  Meanwhile recommends low-      dose Aspirin. We shall institute PT/OT and analgesics, and also      carry out neuro checks.   1. History of dyslipidemia:  We shall do TSH and lipid profile.   1. History of anxiety.  Mood appears stable at present. We shall      observe.   Further management will depend on clinical course.      Isidor Holts, M.D.  Electronically Signed     CO/MEDQ  D:  09/12/2006  T:  09/12/2006  Job:  [id number]   cc:   Olene Craven, M.D.  Fax: [phone]

## 2011-01-31 NOTE — H&P (Signed)
NAME:  [name], [name]                         ACCOUNT NO.:  [id number]   MEDICAL RECORD NO.:  [id number]                   PATIENT TYPE:  EMS   LOCATION:  MAJO                                 FACILITY:  [facility]   PHYSICIAN:  Ralene Ok, M.D.                   DATE OF BIRTH:  13-Jul-1946   DATE OF ADMISSION:  10/12/2002  DATE OF DISCHARGE:                                HISTORY & PHYSICAL   CHIEF COMPLAINT:  Chest pain.   HISTORY OF PRESENT ILLNESS:  This 65 year old white female presented to the  emergency room with a history of two days onset neck and shoulder and  substernal chest pain.  At 12 o'clock this afternoon, the patient said she  developed shortness of breath and worsening chest pain.   PAST MEDICAL HISTORY:  The patient was normally seen by Dr. Kern Reap  in the outpatient setting.  Her only past medical history is significant for  low back ache secondary to degenerative disk disease for which the patient  takes Vicodin on a p.r.n. basis.  She is status post hysterectomy; exact  etiology of this is unclear.   ALLERGIES:  No known drug allergies.   SOCIAL HISTORY:  The patient denies any alcohol or tobacco abuse.  The  patient has four children.   FAMILY HISTORY:  Significant for coronary artery disease in her mother of  age of 56.   PHYSICAL EXAMINATION:  GENERAL:  The patient is comfortable, lying in bed,  not in pain.  HEENT:  Normal.  NECK:  Supple.  Turning the neck to the right side produced some discomfort.  JVD was normal.  There were no bruits.  VITAL SIGNS:  Heart rate 85 beats per minute, blood pressure 134/77.  CARDIAC:  No rubs or murmurs noted.  CHEST: Clear to auscultation. Tenderness was elicited over the left sternal  border and on palpation of the thoracic spine.  ABDOMEN:  Soft.  Generalized tenderness, more so in the epigastrium and  lower abdomen.  No masses were palpable.  Bowel sounds were heard normally.  NEUROLOGIC:  Alert and  oriented x 3.  No localizing neurological deficit  noted with power equal on right and left.  EXTREMITIES:  Examination of the lower limbs showed no pedal edema.  There  were bilateral pulses palpable equally.  Examination of the hands showed  osteoarthritic changes.   LABORATORY DATA:  EKG done in the emergency room shows sinus tachycardia  with no acute changes.   Initial laboratory tests showed glucose 105, BUN 10, creatinine 0.7.  Sodium  and potassium were normal.  Hemoglobin 14, hematocrit 40.  Troponin and CK  levels are pending.   Chest x-ray results are also pending.   IMPRESSION:  1. Atypical chest pain with chest wall tenderness unlikely to be cardiac in     nature, although the patient does have risk factors for  heart disease     including hyperlipidemia, family history, and menopausal age.  In view of     this, the patient will be admitted to a monitored bed. We will check     serial electrocardiograms and cardiac enzymes.  The patient will continue     on Vicodin for pain. We will place her on Prevacid 40 mg p.o. daily for     gastrointestinal pin, morphine 5 mg IV q.4h. p.r.n. for severe pain,     Reglan 10 mg IV q.6h. p.r.n. for nausea as patient says she is allergic     to PHENERGAN, add aspirin 325 mg p.o. daily and Lovenox 40 mg     subcutaneously daily while patient is on bed rest and nitroglycerin     sublingual p.r.n. x 3 for chest pain.  The patient will be admitted to     Dr. Loney Laurence service.                                               Ralene Ok, M.D.    RM/MEDQ  D:  10/12/2002  T:  10/12/2002  Job:  [id number]

## 2011-01-31 NOTE — H&P (Signed)
[name], PALAZZOLA NO.:  [id number]   MEDICAL RECORD NO.:  [id number]          PATIENT TYPE:  INP   LOCATION:  0104                         FACILITY:  [facility]   PHYSICIAN:  Elliot Cousin, M.D.    DATE OF BIRTH:  11-26-1945   DATE OF ADMISSION:  10/21/2006  DATE OF DISCHARGE:                              HISTORY & PHYSICAL   PRIMARY CARE PHYSICIAN:  Dr. Kern Reap.   CHIEF COMPLAINT:  Chest pain.   HISTORY OF PRESENT ILLNESS:  The patient is a 65 year old woman with a  past medical history significant for an acute left brain stroke in  December 2007, hyperlipidemia, and cervical spine degenerative joint  disease, who presents to the emergency department with a chief complaint  of chest pain.  The patient states that the chest pain started at  approximately midnight last night; it woke her up out of her sleep.  She  describes the pain as a heaviness.  It is located in the central chest  and substernal area.  At its worst, the pain is rated at a 10/10 in  intensity.  The pain is sometimes sharp and it radiates to the left  lower back, the left neck, and down the left arm.  She has had  associated generalized weakness, nausea, and shortness of breath.  She  denies diaphoresis, dizziness, and swelling in her legs.  When she  arrived to the emergency department, she was given 4 baby aspirin and  morphine, which decreased her pain to a 5/10 in intensity.  She was not  given nitroglycerin.   During the evaluation in the emergency department, the patient is noted  to be hemodynamically stable.  She is oxygenating 98% on 2 L of nasal  cannula oxygen.  Her EKG reveals sinus tachycardia with a heart rate of  101 beats per minute and no other abnormalities.  Her initial cardiac  markers are negative.  However, she will be admitted for further  evaluation and management, given her cardiac risk factors.   PAST MEDICAL HISTORY:  1. Acute left brain stroke in December  2007.  She has residual right-      sided numbness.  2. Hyperlipidemia.  Her fasting lipid panel in December 2007 showed a      total cholesterol of 213, triglycerides of 298, HDL cholesterol of      38, and LDL of 115.  3. Generalized anxiety.  4. Gastroesophageal reflux disease.  5. Thyroid nodules of the right lobe of the thyroid gland per CT scan      of the neck.  The radiologist recommends a followup CT scan of the      neck and chest in 6 months.  6. Cervical spine degenerative joint disease with radiculopathy,      status post C5-C6 and C6-C7 diskectomies with bilateral      foraminotomies and interbody fusion with allograft plate at C5 to      C7 per Dr. Jeral Fruit on October 01, 2004.   MEDICATIONS:  1. Zocor 20 mg daily.  2. Aspirin 325 mg daily.  SOCIAL HISTORY:  The patient is married.  [place]  She is a former Lawyer.  She is currently pursuing disability.  She denies alcohol, drug use and tobacco use.   FAMILY HISTORY:  Her mother has coronary artery disease and a history of  CABG at 33 years of age.  Her father died of lung cancer.  Her brother  died of kidney cancer.   REVIEW OF SYSTEMS:  The patient's review of systems is positive for neck  pain, occasional pounding of her heart beat in her ears, headache, lower  back pain, tingling in her toes.  Otherwise, review of systems is  negative.   EXAM:  VITAL SIGNS:  Temperature 97.0, blood pressure 119/74, pulse 95,  respiratory rate 19, oxygen saturation 99% on 2 L of nasal cannula  oxygen.  GENERAL:  The patient is a pleasant 65 year old Caucasian woman who is  currently lying in bed in no acute distress.  HEENT:  Head is normocephalic and non-traumatic.  Pupils are equal,  round and reactive to light.  Extraocular movements are intact.  Conjunctivae are clear.  Sclerae are white.  Nasal mucosa is mildly dry.  No sinus tenderness.  Oropharynx reveals upper dentures.  Mucous   membranes are mildly dry.  No posterior exudates or erythema.  NECK:  Well-healed horizontal scar, nontender.  Neck is supple with no  adenopathy, no thyromegaly, no bruit, no JVD, mild appreciable  tenderness.  HEART:  S1 and S2 with a soft systolic murmur.  LUNGS:  Clear to auscultation bilaterally.  ABDOMEN:  Positive bowel sounds.  Soft, nontender and non-distended.  No  hepatosplenomegaly.  No masses palpated.  EXTREMITIES:  Pedal pulses 2+ bilaterally.  No pretibial edema and no  pedal edema.  NEUROLOGIC:  The patient is alert and oriented x3.  Cranial nerves II-  XII are intact.  Strength is 5/5 throughout.  Sensation is intact.   ADMISSION LABORATORY DATA:  Troponin I less than 0.05, CK-MB less than  1, myoglobin 42.9.  WBC 5.9, hemoglobin 13.2, platelets 187,000.  Sodium  141, potassium 3.8, chloride 108, CO2 24, glucose 105, BUN 9, creatinine  0.66, calcium 9.0.   ASSESSMENT:  1. Chest pain.  The patient's chest pain appears atypical; however,      she does have risk factors for coronary artery disease including      cerebrovascular disease, hyperlipidemia, age, and family history.      Her EKG is non-acute and her initial cardiac markers are negative.      Other possibilities for chest pain include pulmonary embolus,      gastroesophageal reflux disease, anxiety, and radiation of pain      from the cervical spine.  2. Hyperlipidemia.  The patient is treated with Zocor 20 mg daily.  3. Gastroesophageal reflux disease.  The patient apparently is not      being treated with a proton pump inhibitor or an H2 blocker.  She      has no active complaints of reflux symptoms.  4. Recent left brain stroke with residual right-sided numbness.  5. Thyroid nodules per CT scan of the neck in December 2007.  The      patient needs a followup CT scan of the neck and chest, as      recommended in 6 months.   PLAN: 1. The patient will be admitted for further evaluation and management.  2.  We will check cardiac enzymes q.8 h. x3.  We will also check a 2-D      echocardiogram to evaluate the patient's LV function and pain.  3. We will check a D-dimer.  If it is elevated, then a CT scan of the      chest or a V/Q scan will be ordered to rule out PE.  4. As-needed morphine and sublingual nitroglycerin for pain.  5. We will start full-dose Lovenox, metoprolol at 12.5 mg b.i.d. and      aspirin.  If the patient rules out for a myocardial infarction, the      beta blocker and full-dose Lovenox can be discontinued.      Elliot Cousin, M.D.  Electronically Signed     DF/MEDQ  D:  10/21/2006  T:  10/21/2006  Job:  [id number]   cc:   Olene Craven, M.D.  Fax: [phone]

## 2011-06-09 LAB — BASIC METABOLIC PANEL
BUN: 6
Calcium: 8.7
Chloride: 107
Creatinine, Ser: 0.81
GFR calc non Af Amer: >60
Glucose, Bld: 89
Glucose, Bld: 89
Potassium: 4.1
Sodium: 138

## 2011-06-09 LAB — URINALYSIS, ROUTINE W REFLEX MICROSCOPIC
Bilirubin Urine: NEGATIVE
Bilirubin Urine: NEGATIVE
Bilirubin Urine: NEGATIVE
Hgb urine dipstick: NEGATIVE
Hgb urine dipstick: NEGATIVE
Nitrite: NEGATIVE
Nitrite: NEGATIVE
Protein, ur: NEGATIVE
Specific Gravity, Urine: >1.046 — ABNORMAL HIGH
Urobilinogen, UA: 0.2
Urobilinogen, UA: 0.2
pH: 5.5

## 2011-06-09 LAB — CBC
HCT: 33.4 — ABNORMAL LOW
HCT: 34 — ABNORMAL LOW
Hemoglobin: 11.9 — ABNORMAL LOW
Hemoglobin: 12.5
MCHC: 34
MCHC: 35.3
MCV: 84.1
MCV: 84.9
MCV: 85.1
Platelets: 144 — ABNORMAL LOW
Platelets: 171
Platelets: 172
Platelets: 199
RDW: 12.5
RDW: 12.8
RDW: 13

## 2011-06-09 LAB — COMPREHENSIVE METABOLIC PANEL
AST: 32
Albumin: 3.1 — ABNORMAL LOW
Albumin: 4
BUN: 7
Calcium: 8.5
Calcium: 9.6
Creatinine, Ser: 0.83
Creatinine, Ser: 0.84
GFR calc Af Amer: >60
Potassium: 4.2
Total Protein: 6.4
Total Protein: 7.6

## 2011-06-09 LAB — DIFFERENTIAL
Eosinophils Relative: 0
Lymphocytes Relative: 10 — ABNORMAL LOW
Lymphocytes Relative: 11 — ABNORMAL LOW
Lymphs Abs: 1.2
Lymphs Abs: 1.3
Monocytes Absolute: 0.9
Monocytes Absolute: 1.1 — ABNORMAL HIGH
Monocytes Relative: 8
Neutro Abs: 9.8 — ABNORMAL HIGH

## 2011-06-09 LAB — URINE CULTURE

## 2011-06-09 LAB — URINE MICROSCOPIC-ADD ON

## 2011-06-11 LAB — URINALYSIS, ROUTINE W REFLEX MICROSCOPIC
Bilirubin Urine: NEGATIVE
Bilirubin Urine: NEGATIVE
Glucose, UA: NEGATIVE
Hgb urine dipstick: NEGATIVE
Ketones, ur: NEGATIVE
Nitrite: NEGATIVE
Protein, ur: NEGATIVE
Protein, ur: NEGATIVE
Urobilinogen, UA: 0.2
Urobilinogen, UA: 0.2
pH: 6.5

## 2011-06-11 LAB — POCT I-STAT, CHEM 8
BUN: 8
Calcium, Ion: 1.17
Chloride: 104
Creatinine, Ser: 0.9
Glucose, Bld: 94
HCT: 41
Hemoglobin: 13.9
Potassium: 4
Sodium: 140
TCO2: 25

## 2011-06-11 LAB — COMPREHENSIVE METABOLIC PANEL WITH GFR
Albumin: 4.1
Alkaline Phosphatase: 85
BUN: 6
GFR calc Af Amer: >60
Potassium: 4
Sodium: 139
Total Protein: 7.2

## 2011-06-11 LAB — COMPREHENSIVE METABOLIC PANEL
ALT: 20
ALT: 23
AST: 25
AST: 29
Albumin: 4
Alkaline Phosphatase: 83
CO2: 28
Calcium: 9.1
Calcium: 9.5
Chloride: 103
Creatinine, Ser: 0.73
GFR calc Af Amer: >60
GFR calc non Af Amer: >60
Glucose, Bld: 102 — ABNORMAL HIGH
Glucose, Bld: 95
Potassium: 3.6
Sodium: 140
Total Bilirubin: 0.5
Total Protein: 7.3

## 2011-06-11 LAB — CBC
HCT: 41.7
Hemoglobin: 13.7
Hemoglobin: 14.1
MCHC: 34.4
MCHC: 33.9
MCHC: 34.3
MCV: 85.7
Platelets: 197
RBC: 4.62
RBC: 4.87
RDW: 13
WBC: 6.5
WBC: 4.7

## 2011-06-11 LAB — DIFFERENTIAL
Basophils Relative: 1
Eosinophils Absolute: 0.1
Eosinophils Absolute: 0.1
Eosinophils Relative: 2
Lymphs Abs: 1.8
Lymphs Abs: 2
Monocytes Absolute: 0.6
Monocytes Absolute: 0.6
Monocytes Relative: 12
Monocytes Relative: 14 — ABNORMAL HIGH
Neutrophils Relative %: 49

## 2011-06-11 LAB — LIPASE, BLOOD: Lipase: 17

## 2011-06-23 LAB — HEPATIC FUNCTION PANEL
Albumin: 4.2
Total Bilirubin: 0.9
Total Protein: 7.9

## 2011-06-23 LAB — DIFFERENTIAL
Basophils Absolute: 0
Basophils Relative: 0
Neutro Abs: 4.4
Neutrophils Relative %: 55

## 2011-06-23 LAB — CK TOTAL AND CKMB (NOT AT ARMC): CK, MB: 1.4

## 2011-06-23 LAB — BASIC METABOLIC PANEL
CO2: 24
CO2: 26
Calcium: 9.9
Chloride: 110
Creatinine, Ser: 0.97
Creatinine, Ser: 0.7
GFR calc Af Amer: >60
GFR calc Af Amer: >60
Glucose, Bld: 99
Glucose, Bld: 102 — ABNORMAL HIGH

## 2011-06-23 LAB — LIPASE, BLOOD: Lipase: 19

## 2011-06-23 LAB — POCT CARDIAC MARKERS
CKMB, poc: 1.3
Operator id: 1415
Troponin i, poc: <0.05
Troponin i, poc: <0.05

## 2011-06-23 LAB — CBC
Hemoglobin: 13.9
MCHC: 35
MCHC: 35.5
MCV: 85.6
Platelets: 202
RBC: 4.65
RDW: 13.2
RDW: 12.8

## 2011-06-23 LAB — TROPONIN I: Troponin I: 0.02

## 2011-06-23 LAB — HOMOCYSTEINE: Homocysteine: 6.3

## 2011-06-23 LAB — D-DIMER, QUANTITATIVE: D-Dimer, Quant: <0.22

## 2011-06-23 LAB — CARDIAC PANEL(CRET KIN+CKTOT+MB+TROPI)
CK, MB: 1.3
Relative Index: INVALID
Total CK: 51
Total CK: 61

## 2011-06-23 LAB — LIPID PANEL
Cholesterol: 206 — ABNORMAL HIGH
LDL Cholesterol: 133 — ABNORMAL HIGH
VLDL: 41 — ABNORMAL HIGH

## 2011-08-29 ENCOUNTER — Emergency Department (HOSPITAL_COMMUNITY): Payer: Medicare Other

## 2011-08-29 ENCOUNTER — Emergency Department (HOSPITAL_COMMUNITY)
Admission: EM | Admit: 2011-08-29 | Discharge: 2011-08-29 | Disposition: A | Discharge status: Home / Self Care | Payer: Medicare Other | Attending: Emergency Medicine | Admitting: Emergency Medicine

## 2011-08-29 ENCOUNTER — Encounter (HOSPITAL_COMMUNITY): Payer: Self-pay | Admitting: *Deleted

## 2011-08-29 ENCOUNTER — Other Ambulatory Visit: Payer: Self-pay

## 2011-08-29 ENCOUNTER — Encounter: Payer: Self-pay | Admitting: Emergency Medicine

## 2011-08-29 DIAGNOSIS — N390 Urinary tract infection, site not specified: Secondary | ICD-10-CM | POA: Insufficient documentation

## 2011-08-29 DIAGNOSIS — Z7982 Long term (current) use of aspirin: Secondary | ICD-10-CM | POA: Insufficient documentation

## 2011-08-29 DIAGNOSIS — R0602 Shortness of breath: Secondary | ICD-10-CM | POA: Insufficient documentation

## 2011-08-29 DIAGNOSIS — I951 Orthostatic hypotension: Secondary | ICD-10-CM | POA: Insufficient documentation

## 2011-08-29 DIAGNOSIS — R300 Dysuria: Secondary | ICD-10-CM | POA: Insufficient documentation

## 2011-08-29 DIAGNOSIS — R29898 Other symptoms and signs involving the musculoskeletal system: Secondary | ICD-10-CM | POA: Insufficient documentation

## 2011-08-29 DIAGNOSIS — Z8673 Personal history of transient ischemic attack (TIA), and cerebral infarction without residual deficits: Secondary | ICD-10-CM | POA: Insufficient documentation

## 2011-08-29 DIAGNOSIS — R5381 Other malaise: Secondary | ICD-10-CM | POA: Insufficient documentation

## 2011-08-29 DIAGNOSIS — K219 Gastro-esophageal reflux disease without esophagitis: Secondary | ICD-10-CM | POA: Insufficient documentation

## 2011-08-29 DIAGNOSIS — F411 Generalized anxiety disorder: Secondary | ICD-10-CM | POA: Insufficient documentation

## 2011-08-29 DIAGNOSIS — M549 Dorsalgia, unspecified: Secondary | ICD-10-CM | POA: Insufficient documentation

## 2011-08-29 DIAGNOSIS — R079 Chest pain, unspecified: Secondary | ICD-10-CM | POA: Insufficient documentation

## 2011-08-29 DIAGNOSIS — R42 Dizziness and giddiness: Secondary | ICD-10-CM | POA: Insufficient documentation

## 2011-08-29 DIAGNOSIS — R1032 Left lower quadrant pain: Secondary | ICD-10-CM | POA: Insufficient documentation

## 2011-08-29 DIAGNOSIS — R350 Frequency of micturition: Secondary | ICD-10-CM | POA: Insufficient documentation

## 2011-08-29 HISTORY — DX: Chronic obstructive pulmonary disease, unspecified: J44.9

## 2011-08-29 HISTORY — DX: Unspecified osteoarthritis, unspecified site: M19.90

## 2011-08-29 LAB — COMPREHENSIVE METABOLIC PANEL
ALT: 28 U/L (ref 0–35)
AST: 26 U/L (ref 0–37)
CO2: 23 mEq/L (ref 19–32)
Calcium: 10.2 mg/dL (ref 8.4–10.5)
Chloride: 103 mEq/L (ref 96–112)
Creatinine, Ser: 0.67 mg/dL (ref 0.50–1.10)
GFR calc Af Amer: >90 mL/min (ref >90)
GFR calc non Af Amer: >90 mL/min (ref >90)
Glucose, Bld: 113 mg/dL — ABNORMAL HIGH (ref 70–99)
Sodium: 141 mEq/L (ref 135–145)
Total Bilirubin: 0.4 mg/dL (ref 0.3–1.2)

## 2011-08-29 LAB — CBC
HCT: 38.9 % (ref 36.0–46.0)
Hemoglobin: 13.5 g/dL (ref 12.0–15.0)
Hemoglobin: 13.7 g/dL (ref 12.0–15.0)
MCH: 31 pg (ref 26.0–34.0)
MCHC: 34.7 g/dL (ref 30.0–36.0)
MCHC: 36.1 g/dL — ABNORMAL HIGH (ref 30.0–36.0)
Platelets: 197 10*3/uL (ref 150–400)
RBC: 4.53 MIL/uL (ref 3.87–5.11)
WBC: 7.2 10*3/uL (ref 4.0–10.5)

## 2011-08-29 LAB — URINALYSIS, ROUTINE W REFLEX MICROSCOPIC
Glucose, UA: NEGATIVE mg/dL
Ketones, ur: 15 mg/dL — AB
Nitrite: NEGATIVE
Nitrite: NEGATIVE
Protein, ur: NEGATIVE mg/dL
Specific Gravity, Urine: 1.019 (ref 1.005–1.030)
Specific Gravity, Urine: 1.017 (ref 1.005–1.030)
Urobilinogen, UA: 0.2 mg/dL (ref 0.0–1.0)
pH: 7.5 (ref 5.0–8.0)

## 2011-08-29 LAB — POCT I-STAT, CHEM 8
BUN: 13 mg/dL (ref 6–23)
Chloride: 108 mEq/L (ref 96–112)
Creatinine, Ser: 0.7 mg/dL (ref 0.50–1.10)
Potassium: 3.7 mEq/L (ref 3.5–5.1)
Sodium: 142 mEq/L (ref 135–145)
TCO2: 24 mmol/L (ref 0–100)

## 2011-08-29 LAB — URINE MICROSCOPIC-ADD ON

## 2011-08-29 LAB — LIPASE, BLOOD: Lipase: 16 U/L (ref 11–59)

## 2011-08-29 LAB — DIFFERENTIAL
Basophils Absolute: 0 10*3/uL (ref 0.0–0.1)
Basophils Relative: 0 % (ref 0–1)
Basophils Relative: 0 % (ref 0–1)
Eosinophils Absolute: 0.1 10*3/uL (ref 0.0–0.7)
Eosinophils Relative: 1 % (ref 0–5)
Lymphocytes Relative: 14 % (ref 12–46)
Monocytes Relative: 6 % (ref 3–12)
Neutro Abs: 5.7 10*3/uL (ref 1.7–7.7)
Neutro Abs: 7.5 10*3/uL (ref 1.7–7.7)
Neutrophils Relative %: 79 % — ABNORMAL HIGH (ref 43–77)
Neutrophils Relative %: 75 % (ref 43–77)

## 2011-08-29 LAB — TROPONIN I: Troponin I: <0.3 ng/mL (ref <0.30)

## 2011-08-29 MED ORDER — OXYCODONE-ACETAMINOPHEN 5-325 MG PO TABS: 1.0000 | ORAL_TABLET | Freq: Four times a day (QID) | ORAL | Status: DC | PRN

## 2011-08-29 MED ORDER — ONDANSETRON HCL 4 MG/2ML IJ SOLN
4.0000 mg | Freq: Once | INTRAMUSCULAR | Status: AC
  Administered 2011-08-29: 4 mg via INTRAVENOUS
  Filled 2011-08-29: qty 2

## 2011-08-29 MED ORDER — SODIUM CHLORIDE 0.9 % IV BOLUS (SEPSIS)
1000.0000 mL | Freq: Once | INTRAVENOUS | Status: AC
  Administered 2011-08-29: 1000 mL via INTRAVENOUS

## 2011-08-29 MED ORDER — IBUPROFEN 800 MG PO TABS
800.0000 mg | ORAL_TABLET | Freq: Once | ORAL | Status: AC
  Administered 2011-08-29: 800 mg via ORAL
  Filled 2011-08-29: qty 1

## 2011-08-29 MED ORDER — SODIUM CHLORIDE 0.9 % IV BOLUS (SEPSIS): 1000.0000 mL | Freq: Once | INTRAVENOUS | Status: DC

## 2011-08-29 MED ORDER — CIPROFLOXACIN HCL 500 MG PO TABS: 500.0000 mg | ORAL_TABLET | Freq: Two times a day (BID) | ORAL | Status: AC

## 2011-08-29 NOTE — ED Notes (Signed)
C/o painful urination, back pain, sob and body aching. Pt states "it started early yesterday and got worse over the day and last night"

## 2011-08-29 NOTE — ED Provider Notes (Signed)
History     CSN: [id number] Arrival date & time: 08/29/2011  8:16 AM   First MD Initiated Contact with Patient 08/29/11 [phone]      Chief Complaint  Patient presents with  . Back Pain  . Dysuria  . Shortness of Breath    (Consider location/radiation/quality/duration/timing/severity/associated sxs/prior treatment) Patient is a 65 y.o. female presenting with back pain, dysuria, and shortness of breath. The history is provided by the patient.  Back Pain  Associated symptoms include dysuria and weakness. Pertinent negatives include no chest pain, no numbness, no headaches and no abdominal pain.  Dysuria  Pertinent negatives include no nausea, no vomiting and no flank pain.  Shortness of Breath  Associated symptoms include shortness of breath. Pertinent negatives include no chest pain.   patient has had some dysuria and lower dominant pain for last couple days. She also some pain in her lower back. She's had little bit of fatigue with a. Some mild shortness of breath. No cough. No nausea vomiting or diarrhea. She has had decreased oral intake, she states she's just not hungry. She states she likes a little weak. Abdominal pain as dull. Feels the previous urinary tract infections. No clear sick contacts. She had a previous hysterectomy. The pain in her back is in the left lower back.  Past Medical History  Diagnosis Date  . Arthritis   . COPD (chronic obstructive pulmonary disease)     Past Surgical History  Procedure Date  . Abdominal hysterectomy   . Tonsillectomy     History reviewed. No pertinent family history.  History  Substance Use Topics  . Smoking status: Never Smoker   . Smokeless tobacco: Not on file  . Alcohol Use: No    OB History    Grav Para Term Preterm Abortions TAB SAB Ect Mult Living                  Review of Systems  Constitutional: Positive for appetite change and fatigue. Negative for activity change.  HENT: Negative for neck stiffness.   Eyes:  Negative for pain.  Respiratory: Positive for shortness of breath. Negative for chest tightness.   Cardiovascular: Negative for chest pain and leg swelling.  Gastrointestinal: Negative for nausea, vomiting, abdominal pain and diarrhea.  Genitourinary: Positive for dysuria. Negative for flank pain.  Musculoskeletal: Positive for back pain.  Skin: Negative for rash.  Neurological: Positive for weakness. Negative for numbness and headaches.  Psychiatric/Behavioral: Negative for behavioral problems.    Allergies  Phenergan  Home Medications   Current Outpatient Rx  Name Route Sig Dispense Refill  . ASPIRIN EC 81 MG PO TBEC Oral Take 162 mg by mouth daily.      . OMEGA-3 FATTY ACIDS 1000 MG PO CAPS Oral Take 2 g by mouth daily.      Marland Kitchen FLAX SEED OIL PO Oral Take 1 tablet by mouth daily.        BP 143/74  Pulse 91  Temp(Src) 98.6 F (37 C) (Oral)  Resp 18  Wt 154 lb (69.854 kg)  SpO2 99%  Physical Exam  Nursing note and vitals reviewed. Constitutional: She is oriented to person, place, and time. She appears well-developed and well-nourished.  HENT:  Head: Normocephalic and atraumatic.  Eyes: EOM are normal. Pupils are equal, round, and reactive to light.  Neck: Normal range of motion. Neck supple.  Cardiovascular: Normal rate, regular rhythm and normal heart sounds.   No murmur heard. Pulmonary/Chest: Effort normal and breath sounds  normal. No respiratory distress. She has no wheezes. She has no rales.  Abdominal: Soft. Bowel sounds are normal. She exhibits no distension. There is tenderness. There is no rebound and no guarding.       Minimal lower bowel tenderness without rebound or guarding.  Genitourinary:       No CVA tenderness  Musculoskeletal: Normal range of motion.  Neurological: She is alert and oriented to person, place, and time. No cranial nerve deficit.  Skin: Skin is warm and dry.  Psychiatric: She has a normal mood and affect. Her speech is normal.    ED  Course  Procedures (including critical care time)  Labs Reviewed  URINALYSIS, ROUTINE W REFLEX MICROSCOPIC - Abnormal; Notable for the following:    Ketones, ur 15 (*)    Leukocytes, UA SMALL (*)    All other components within normal limits  DIFFERENTIAL - Abnormal; Notable for the following:    Neutrophils Relative 79 (*)    All other components within normal limits  POCT I-STAT, CHEM 8 - Abnormal; Notable for the following:    Glucose, Bld 127 (*)    Calcium, Ion 1.07 (*)    All other components within normal limits  URINE MICROSCOPIC-ADD ON - Abnormal; Notable for the following:    Squamous Epithelial / LPF FEW (*)    Bacteria, UA FEW (*)    All other components within normal limits  CBC  I-STAT, CHEM 8  URINE CULTURE   No results found.   1. Dysuria   2. Back pain       MDM  Patient has dysuria and low back pain. Urine does not show infection. Her white count is normal. Minus a minimal left shift. She is minimally tender. Urine culture is sent. I doubt diverticulitis at this time does not appear to be an ejection fraction. Lungs are clear. She'll be discharged home.        Juliet Rude. Rubin Payor, MD 08/29/11 [phone]

## 2011-08-29 NOTE — ED Notes (Signed)
Airway intact, resp e/u. Reports also going to see pcp this afternoon and reports being sent here for cardiac eval. ekg being done at triage.

## 2011-08-29 NOTE — ED Notes (Signed)
Attempted iv site x 2 attempts in right forearm, sites would not advance and infiltrated.

## 2011-08-29 NOTE — ED Notes (Signed)
I saw and evaluated the patient, reviewed the resident's note and I agree with the findings and plan.  1 day of generalized weakness, dysuria, low back pain, SOB, lightheadedness.  Seen at [facility] earlier and had negative UA.  Called nurse at PCP's office and nurse was concerned heart wasn't evaluated.  +orthostasis.  Borderline UA, culture sent.  No chest pain or EKG changes.  Ddimer neg.   Glynn Octave, MD 08/29/11 [phone]

## 2011-08-29 NOTE — ED Notes (Signed)
Pt reports having sob , generalized weakness, chest tightness since this am, went to [facility] this am and dc home.

## 2011-08-29 NOTE — ED Notes (Signed)
Patient transported to X-ray via Doctor, general practice per Francis Dowse, RT

## 2011-08-29 NOTE — ED Provider Notes (Signed)
I saw and evaluated the patient, reviewed the resident's note and I agree with the findings and plan.   Beth Octave, MD 08/29/11 [phone]

## 2011-08-29 NOTE — ED Provider Notes (Signed)
History     CSN: [id number] Arrival date & time: 08/29/2011  2:19 PM   First MD Initiated Contact with Patient 08/29/11 1425      Chief Complaint  Patient presents with  . Shortness of Breath  . Chest Pain    (Consider location/radiation/quality/duration/timing/severity/associated sxs/prior treatment) HPI [name] is a 65 year old woman with past medical history significant for CVA, anxiety and GERD who presents with weakness and shortness of breath and lower quadrant pain.  Is [name] previously presented to the [facility] ED this morning with symptoms of back pain, dysuria, and shortness of breath. Workup at that time showed no leukocytosis or pyuria she was discharged to home.  [name] states her symptoms began approximately morning yesterday when she is watching TV. She states she is feeling weak in both her arms and her legs. She also endorses symptoms of orthostasis and dizziness. She endorses seeing flashes of light she stands up. She also endorses mild shortness of breath that began at the same time. She denies pleuritic chest pain or any chest pain at all. She does endorse left lower quadrant pain. It began yesterday as well. She states it is constant burning stinging pain. It's worse with movement. She states it radiates to her suprapubic area. She denies fever, chills or night sweats. Endorses one episode of nausea last night. Denies any current nausea or vomiting. She has not tried taking any medications to relieve the pain. She states nothing makes the pain better. She has a history of diverticulosis and appendectomy.  Denies headache.  Past Medical History  Diagnosis Date  . Arthritis   . COPD (chronic obstructive pulmonary disease)     Past Surgical History  Procedure Date  . Abdominal hysterectomy   . Tonsillectomy     History reviewed. No pertinent family history.  History  Substance Use Topics  . Smoking status: Never Smoker   . Smokeless tobacco:  Not on file  . Alcohol Use: No    OB History    Grav Para Term Preterm Abortions TAB SAB Ect Mult Living                  Review of Systems  Constitutional: Negative for fever, chills, diaphoresis, activity change and appetite change.  HENT: Negative for congestion, sore throat and trouble swallowing.   Eyes: Negative for visual disturbance.  Respiratory: Positive for shortness of breath. Negative for cough, chest tightness and wheezing.   Cardiovascular: Negative for chest pain, palpitations and leg swelling.  Gastrointestinal: Positive for abdominal pain. Negative for nausea, vomiting, diarrhea, constipation and blood in stool.  Genitourinary: Positive for frequency. Negative for dysuria, urgency, flank pain and difficulty urinating.  Musculoskeletal: Positive for back pain.  Neurological: Positive for dizziness, weakness and light-headedness. Negative for tremors, syncope, numbness and headaches.  Psychiatric/Behavioral: Negative for confusion.    Allergies  Phenergan  Home Medications   Current Outpatient Rx  Name Route Sig Dispense Refill  . ASPIRIN EC 81 MG PO TBEC Oral Take 162 mg by mouth daily.      . OMEGA-3 FATTY ACIDS 1000 MG PO CAPS Oral Take 2 g by mouth daily.     Marland Kitchen FLAX SEED OIL PO Oral Take 2 tablets by mouth daily.       BP 142/80  Pulse 96  Temp(Src) 98 F (36.7 C) (Oral)  Resp 18  SpO2 98%  Physical Exam  Constitutional: She is oriented to person, place, and time. She appears well-nourished. No distress.  HENT:  Head: Normocephalic and atraumatic.  Eyes: Conjunctivae and EOM are normal. Pupils are equal, round, and reactive to light.  Neck: Normal range of motion. Neck supple.  Cardiovascular: Normal rate, regular rhythm, normal heart sounds and intact distal pulses.  Exam reveals no gallop and no friction rub.   No murmur heard. Pulmonary/Chest: Effort normal and breath sounds normal. No respiratory distress. She has no wheezes. She has no rales.  She exhibits no tenderness.  Abdominal: Soft. Bowel sounds are normal. She exhibits no distension. There is no tenderness. There is no rebound and no guarding.       I was able to palpate deeply into the left lower quadrant and the patient was distracted.  Musculoskeletal: Normal range of motion. She exhibits no edema.  Neurological: She is alert and oriented to person, place, and time. She has normal strength. She displays no tremor. No cranial nerve deficit or sensory deficit. She exhibits normal muscle tone. She displays a negative Romberg sign. Coordination normal. She displays no Babinski's sign on the right side. She displays no Babinski's sign on the left side.  Reflex Scores:      Bicep reflexes are 3+ on the right side and 3+ on the left side.      Brachioradialis reflexes are 3+ on the right side and 3+ on the left side.      Patellar reflexes are 3+ on the right side and 3+ on the left side. Skin: Skin is warm and dry. She is not diaphoretic.  Psychiatric: Her speech is normal and behavior is normal. Judgment and thought content normal. Her mood appears anxious.    ED Course  Procedures (including critical care time)  Labs Reviewed - No data to display No results found.   No diagnosis found.   Date: 08/29/2011  Rate: 97   Rhythm: normal sinus rhythm  QRS Axis: normal  Intervals: normal  ST/T Wave abnormalities: normal  Conduction Disutrbances:none  Narrative Interpretation: Normal EKG regular rate and rhythm. Borderline tachycardia unchanged from March 2012. Possible new T-wave inversion in lead 3. No signs of acute ischemia  Old EKG Reviewed: changes noted    MDM  Unclear root cause of [name]'s symptoms. The most likely cause of her symptoms is that she is volume depleted and orthostatic. We'll obtain basic labs chest x-ray and urinalysis and evaluate further. Given her history of PE, we will check D-Dimer.          Quentin Ore, MD 08/29/11  1529  Quentin Ore, MD 08/29/11 1537

## 2011-08-29 NOTE — ED Notes (Signed)
Paged for IV team

## 2011-08-30 LAB — URINE CULTURE: Culture  Setup Time: 201212141712

## 2011-08-31 LAB — URINE CULTURE

## 2011-11-12 ENCOUNTER — Other Ambulatory Visit: Payer: Self-pay

## 2011-11-12 ENCOUNTER — Encounter (HOSPITAL_COMMUNITY): Payer: Self-pay | Admitting: *Deleted

## 2011-11-12 ENCOUNTER — Emergency Department (HOSPITAL_COMMUNITY)
Admission: EM | Admit: 2011-11-12 | Discharge: 2011-11-12 | Disposition: A | Discharge status: Home / Self Care | Payer: Medicare Other | Attending: Emergency Medicine | Admitting: Emergency Medicine

## 2011-11-12 ENCOUNTER — Emergency Department (HOSPITAL_COMMUNITY): Payer: Medicare Other

## 2011-11-12 DIAGNOSIS — J449 Chronic obstructive pulmonary disease, unspecified: Secondary | ICD-10-CM | POA: Insufficient documentation

## 2011-11-12 DIAGNOSIS — Z7982 Long term (current) use of aspirin: Secondary | ICD-10-CM | POA: Insufficient documentation

## 2011-11-12 DIAGNOSIS — J441 Chronic obstructive pulmonary disease with (acute) exacerbation: Secondary | ICD-10-CM

## 2011-11-12 DIAGNOSIS — J4489 Other specified chronic obstructive pulmonary disease: Secondary | ICD-10-CM | POA: Insufficient documentation

## 2011-11-12 LAB — DIFFERENTIAL
Basophils Relative: 1 % (ref 0–1)
Eosinophils Relative: 1 % (ref 0–5)
Lymphs Abs: 1.2 10*3/uL (ref 0.7–4.0)
Monocytes Absolute: 0.9 10*3/uL (ref 0.1–1.0)

## 2011-11-12 LAB — CBC
Hemoglobin: 13.1 g/dL (ref 12.0–15.0)
MCH: 29.8 pg (ref 26.0–34.0)
MCV: 85.9 fL (ref 78.0–100.0)
Platelets: 204 10*3/uL (ref 150–400)
RBC: 4.39 MIL/uL (ref 3.87–5.11)

## 2011-11-12 LAB — COMPREHENSIVE METABOLIC PANEL
BUN: 13 mg/dL (ref 6–23)
CO2: 23 mEq/L (ref 19–32)
Calcium: 9.6 mg/dL (ref 8.4–10.5)
Creatinine, Ser: 0.92 mg/dL (ref 0.50–1.10)
GFR calc Af Amer: 75 mL/min — ABNORMAL LOW (ref >90)
GFR calc non Af Amer: 64 mL/min — ABNORMAL LOW (ref >90)
Glucose, Bld: 117 mg/dL — ABNORMAL HIGH (ref 70–99)

## 2011-11-12 MED ORDER — ALBUTEROL SULFATE HFA 108 (90 BASE) MCG/ACT IN AERS
2.0000 | INHALATION_SPRAY | Freq: Four times a day (QID) | RESPIRATORY_TRACT | Status: DC
  Administered 2011-11-12: 2 via RESPIRATORY_TRACT
  Filled 2011-11-12: qty 6.7

## 2011-11-12 MED ORDER — PREDNISONE 20 MG PO TABS
ORAL_TABLET | ORAL | Status: AC
  Filled 2011-11-12: qty 3

## 2011-11-12 MED ORDER — ALBUTEROL SULFATE (5 MG/ML) 0.5% IN NEBU
2.5000 mg | INHALATION_SOLUTION | RESPIRATORY_TRACT | Status: AC
  Administered 2011-11-12: 5 mg via RESPIRATORY_TRACT
  Filled 2011-11-12 (×2): qty 0.5

## 2011-11-12 MED ORDER — AZITHROMYCIN 250 MG PO TABS: 250.0000 mg | ORAL_TABLET | Freq: Every day | ORAL | Status: AC

## 2011-11-12 MED ORDER — PREDNISOLONE 5 MG PO TABS
60.0000 mg | ORAL_TABLET | ORAL | Status: AC
  Administered 2011-11-12: 60 mg via ORAL
  Filled 2011-11-12: qty 12

## 2011-11-12 MED ORDER — AZITHROMYCIN 250 MG PO TABS
500.0000 mg | ORAL_TABLET | Freq: Once | ORAL | Status: AC
  Administered 2011-11-12: 500 mg via ORAL
  Filled 2011-11-12: qty 2

## 2011-11-12 MED ORDER — PREDNISONE 10 MG PO TABS: 50.0000 mg | ORAL_TABLET | Freq: Every day | ORAL | Status: AC

## 2011-11-12 NOTE — Discharge Instructions (Signed)
Chronic Bronchitis and Emphysema Exacerbation You have chronic obstructive lung disease which has gotten worse. This can be an increase in your cough, wheezing, or shortness of breath. These problems are often worse during periods of air pollution or if you are exposed to smoke. HOME CARE INSTRUCTIONS   You should avoid all substances which irritate the airway, especially tobacco smoke.   If you are a smoker, consider using nicotine gum or patches to quit. Quitting smoking is very important to prevent worsening of this disease.   Increasing oral fluids and using a cool air vaporizer can help thin bronchial secretions. This makes it easier to clear your chest when you cough.   If you have a home nebulizer and oxygen, continue to use them as directed.  The following medications may be prescribed to help you depending on what your caregiver finds today.   Antibiotics.   Bronchodilators (inhaled or tablets).   Cortisone medicines (inhaled or tablets).   It is important to use good technique with inhaled medications, and spacer devices may be needed to help improve drug delivery. Chronic lung disease is frequently severe and hospital care may be needed. Be sure to follow-up as recommended with your primary caregiver.   Take all medications as directed.  SEEK IMMEDIATE MEDICAL CARE IF:  You develop extreme shortness of breath.   You have severe chest pain or blood in your sputum.   You develop a high fever, weakness, repeated vomiting, or fainting.   You develop confusion.  MAKE SURE YOU:   Understand these instructions.   Will watch your condition.   Will get help right away if you are not doing well or get worse.  Document Released: 06/29/2007 Document Revised: 03/17/2011 Document Reviewed: 06/29/2007 [employer] Patient Information 2012 [employer]. 

## 2011-11-12 NOTE — ED Notes (Signed)
Reports chest heaviness and sob, radiates into her back, feeling lightheaded and dizzy. ekg done at triage. resp e/u. Skin w/d.

## 2011-11-12 NOTE — ED Provider Notes (Signed)
History     CSN: [id number]  Arrival date & time 11/12/11  1517   First MD Initiated Contact with Patient 11/12/11 1619      Chief Complaint  Patient presents with  . Chest Pain    HPI The patient presents with a day of chest pressure, lightheadedness, mild dyspnea.  Her symptoms began insidiously a day ago.  Since onset symptoms have been persistent.  Symptoms seem worse when sitting down to rest.  No clear exacerbating factors.  Pain is described as heaviness, sternal and nonradiating, nonexertional, nonpleuritic.  Past Medical History  Diagnosis Date  . Arthritis   . COPD (chronic obstructive pulmonary disease)     Past Surgical History  Procedure Date  . Abdominal hysterectomy   . Tonsillectomy     History reviewed. No pertinent family history.  History  Substance Use Topics  . Smoking status: Never Smoker   . Smokeless tobacco: Not on file  . Alcohol Use: No    OB History    Grav Para Term Preterm Abortions TAB SAB Ect Mult Living                  Review of Systems  Constitutional:       HPI  HENT:       HPI otherwise negative  Eyes: Negative.   Respiratory:       HPI, otherwise negative  Cardiovascular:       HPI, otherwise nmegative  Gastrointestinal: Negative for vomiting.  Genitourinary:       HPI, otherwise negative  Musculoskeletal:       HPI, otherwise negative  Skin: Negative.   Neurological: Negative for syncope.    Allergies  Phenergan  Home Medications   Current Outpatient Rx  Name Route Sig Dispense Refill  . ASPIRIN EC 81 MG PO TBEC Oral Take 162 mg by mouth daily.      . OMEGA-3 FATTY ACIDS 1000 MG PO CAPS Oral Take 2 g by mouth daily.     Marland Kitchen FLAX SEED OIL PO Oral Take 2 tablets by mouth daily.       BP 147/75  Pulse 99  Temp(Src) 98.4 F (36.9 C) (Oral)  Resp 16  SpO2 96%  Physical Exam  Nursing note and vitals reviewed. Constitutional: She is oriented to person, place, and time. She appears well-developed and  well-nourished. No distress.  HENT:  Head: Normocephalic and atraumatic.  Eyes: Conjunctivae and EOM are normal.  Cardiovascular: Normal rate and regular rhythm.   Pulmonary/Chest: Effort normal and breath sounds normal. No stridor. No respiratory distress.  Abdominal: She exhibits no distension.  Musculoskeletal: She exhibits no edema.  Neurological: She is alert and oriented to person, place, and time. No cranial nerve deficit.  Skin: Skin is warm and dry.  Psychiatric: She has a normal mood and affect.    ED Course  Procedures (including critical care time)   Labs Reviewed  CBC  DIFFERENTIAL  COMPREHENSIVE METABOLIC PANEL  TROPONIN I   No results found.   No diagnosis found.  CXR reviewed by me  Pulse ox 07%RA- normal Cardiac: 85 sr, normal   Date: 11/12/2011  Rate: 99  Rhythm: normal sinus rhythm  QRS Axis: normal  Intervals: normal  ST/T Wave abnormalities: nonspecific ST/T changes  Conduction Disutrbances:none  Narrative Interpretation:   Old EKG Reviewed: changes noted ST depressions in V6 - abnormal   MDM  This 66 year old female with COPD now presents with chest pressure, mild dyspnea, mild cough.  On exam she is in no distress, is not hypoxic.  Patient's labs and chest x-ray are all reassuring.  Given the persistency of her pressure, cardiac etiology would be reflected in a positive troponin, which is the case.  Patient's overall benign appearance his reassuring for the low suspicion of infection.  The patient's presentation is most consistent with COPD exacerbation.  She has a followup appointment scheduled already with her physician for next week.  She is discharged in stable condition.        Gerhard Munch, MD 11/12/11 [phone]

## 2012-02-05 ENCOUNTER — Encounter (HOSPITAL_COMMUNITY): Payer: Self-pay | Admitting: Emergency Medicine

## 2012-02-05 ENCOUNTER — Emergency Department (HOSPITAL_COMMUNITY)
Admission: EM | Admit: 2012-02-05 | Discharge: 2012-02-05 | Disposition: A | Discharge status: Home / Self Care | Payer: Medicare Other | Attending: *Deleted | Admitting: *Deleted

## 2012-02-05 ENCOUNTER — Emergency Department (HOSPITAL_COMMUNITY): Payer: Medicare Other

## 2012-02-05 DIAGNOSIS — J4489 Other specified chronic obstructive pulmonary disease: Secondary | ICD-10-CM | POA: Insufficient documentation

## 2012-02-05 DIAGNOSIS — J449 Chronic obstructive pulmonary disease, unspecified: Secondary | ICD-10-CM | POA: Insufficient documentation

## 2012-02-05 DIAGNOSIS — X500XXA Overexertion from strenuous movement or load, initial encounter: Secondary | ICD-10-CM | POA: Insufficient documentation

## 2012-02-05 DIAGNOSIS — M7989 Other specified soft tissue disorders: Secondary | ICD-10-CM | POA: Insufficient documentation

## 2012-02-05 DIAGNOSIS — M25579 Pain in unspecified ankle and joints of unspecified foot: Secondary | ICD-10-CM | POA: Insufficient documentation

## 2012-02-05 DIAGNOSIS — S82899A Other fracture of unspecified lower leg, initial encounter for closed fracture: Secondary | ICD-10-CM | POA: Insufficient documentation

## 2012-02-05 MED ORDER — IBUPROFEN 800 MG PO TABS: 800.0000 mg | ORAL_TABLET | Freq: Three times a day (TID) | ORAL | Status: AC

## 2012-02-05 MED ORDER — IBUPROFEN 800 MG PO TABS
800.0000 mg | ORAL_TABLET | Freq: Once | ORAL | Status: AC
  Administered 2012-02-05: 800 mg via ORAL
  Filled 2012-02-05: qty 1

## 2012-02-05 MED ORDER — OXYCODONE-ACETAMINOPHEN 5-325 MG PO TABS: ORAL_TABLET | ORAL | Status: AC

## 2012-02-05 NOTE — ED Provider Notes (Signed)
History     CSN: [id number]  Arrival date & time 02/05/12  1130   First MD Initiated Contact with Patient 02/05/12 1134      Chief Complaint  Patient presents with  . Ankle Pain    (Consider location/radiation/quality/duration/timing/severity/associated sxs/prior treatment) HPI  Patient presents to ER complaining of right foot/ankle injury that happened last night when she was stepping off her porch and her right ankle rolled under causing immediate pain and gradual onset bruising and swelling. 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 Patient drove herself to ER today. States she took OTC pain medication last night without relief of pain. Pain has been constant since onset but aggravated by movement, touch and weight bearing. She denies additional injury. She denies extremity numbness or tingling. Patient does not have a orthopedic specialist.   Past Medical History  Diagnosis Date  . Arthritis   . COPD (chronic obstructive pulmonary disease)     Past Surgical History  Procedure Date  . Abdominal hysterectomy   . Tonsillectomy     No family history on file.  History  Substance Use Topics  . Smoking status: Never Smoker   . Smokeless tobacco: Not on file  . Alcohol Use: No    OB History    Grav Para Term Preterm Abortions TAB SAB Ect Mult Living                  Review of Systems  All other systems reviewed and are negative.    Allergies  Morphine and related and Promethazine hcl  Home Medications   Current Outpatient Rx  Name Route Sig Dispense Refill  . ASPIRIN EC 81 MG PO TBEC Oral Take 162 mg by mouth daily.      . OMEGA-3 FATTY ACIDS 1000 MG PO CAPS Oral Take 3 g by mouth daily.     Marland Kitchen FLAX SEED OIL PO Oral Take 2 tablets by mouth daily.     Lowanda Foster EX Apply externally Apply 1  application topically 3 (three) times daily. To rash at nape of neck      BP 126/77  Pulse 89  Temp(Src) 98.7 F (37.1 C) (Oral)  Resp 18  SpO2 99%  Physical Exam  Constitutional: She is oriented to person, place, and time. She appears well-developed and well-nourished. No distress.  HENT:  Head: Normocephalic and atraumatic.  Eyes: Conjunctivae are normal.  Cardiovascular: Normal rate and regular rhythm.   Pulmonary/Chest: Effort normal.  Musculoskeletal: She exhibits edema and tenderness.       Right ankle: She exhibits swelling. tenderness.       Swelling and TTP of right lateral ankle and into forefoot with mild TTP and swelling of fore foot but no TTP or swelling of calf. No break in skin. Good pedal pulse and cap refill of all toes. Wiggling toes without difficulty.   Neurological: She is alert and oriented to person, place, and time.       Normal sensation of entire foot.   Skin: Skin is warm and dry. No rash noted. She is not diaphoretic. No erythema. No pallor.  Psychiatric: She has a normal mood and affect. Her behavior is normal.    ED Course  Procedures (including critical care time)  PO ibuprofen and ice back.   Labs  Reviewed - No data to display Dg Ankle Complete Right  02/05/2012  *RADIOLOGY REPORT*  Clinical Data: Ankle pain, fall, twisting injury.  RIGHT ANKLE - COMPLETE 3+ VIEW  Comparison: 11/19/2008  Findings: There is a small avulsed fragment from the lateral aspect of the hind foot, possibly distal aspect of the calcaneus or possibly from the cuboid.  Mild overlying soft tissue swelling.  No acute bony abnormality within the ankle.  IMPRESSION: Small avulsed fragment off the lateral foot, possible distal calcaneus or cuboid.  Original Report Authenticated By: Cyndie Chime, M.D.   Dg Foot Complete Right  02/05/2012  **ADDENDUM** CREATED: 02/05/2012 12:48:36  The previous dictation used an old ankle series for comparison.  I found an old  foot series to  compare with from 11/19/2008.  This shows that the dorsal avulsion fragment along the distal navicular is old.  No visible acute bony abnormality.  **END ADDENDUM** SIGNED BY: Aubery Lapping. [place], M.D.   02/05/2012  *RADIOLOGY REPORT*  Clinical Data: Fall, twisting injury.  Foot and ankle pain.  RIGHT FOOT COMPLETE - 3+ VIEW  Comparison: 11/19/2008  Findings: Small rounded bone fragment along the dorsal aspect of the distal navicular or cuneiforms , likely small avulsed fragment. This appears well corticated and may be related to old injury. However, there is soft tissue swelling over this area and this was not present 2010. No acute bony abnormality within the ankle.  IMPRESSION: Small avulsed fragment from the dorsum of the foot seen only on lateral view along the distal aspect of the navicular or proximal cuneiforms, age indeterminate.  Original Report Authenticated By: Cyndie Chime, M.D.     1. Avulsion fracture of ankle       MDM  avulsion fracture of right ankle with right ankle stable and neurovasc intact. No other injury noted.         [place], [place] 02/05/12 1309

## 2012-02-05 NOTE — ED Notes (Signed)
Quintin, Ortho tech at bedside.

## 2012-02-05 NOTE — Discharge Instructions (Signed)
Wear ankle splint for stabilization of ankle. Use crutches to keep weight off ankle. Ice and elevate ankle throughout the day. Alternate between ibuprofen and percocet for pain relief. Do not drive or operate machinery with percocet use. Call orthopedic follow up today today or tomorrow to schedule followup appointment for recheck of ankle in the next 1-2 weeks.   Ankle Fracture A fracture is a break in the bone. A cast or splint is used to protect and keep your injured bone from moving.  HOME CARE INSTRUCTIONS   Use your crutches as directed.   To lessen the swelling, keep the injured leg elevated while sitting or lying down.   Apply ice to the injury for 15 to 20 minutes, 3 to 4 times per day while awake for 2 days. Put the ice in a plastic bag and place a thin towel between the bag of ice and your cast.   If you have a plaster or fiberglass cast:   Do not try to scratch the skin under the cast using sharp or pointed objects.   Check the skin around the cast every day. You may put lotion on any red or sore areas.   Keep your cast dry and clean.   If you have a plaster splint:   Wear the splint as directed.   You may loosen the elastic around the splint if your toes become numb, tingle, or turn cold or blue.   Do not put pressure on any part of your cast or splint; it may break. Rest your cast only on a pillow the first 24 hours until it is fully hardened.   Your cast or splint can be protected during bathing with a plastic bag. Do not lower the cast or splint into water.   Take medications as directed by your caregiver. Only take over-the-counter or prescription medicines for pain, discomfort, or fever as directed by your caregiver.   Do not drive a vehicle until your caregiver specifically tells you it is safe to do so.   If your caregiver has given you a follow-up appointment, it is very important to keep that appointment. Not keeping the appointment could result in a chronic  or permanent injury, pain, and disability. If there is any problem keeping the appointment, you must call back to this facility for assistance.  SEEK IMMEDIATE MEDICAL CARE IF:   Your cast gets damaged or breaks.   You have continued severe pain or more swelling than you did before the cast was put on.   Your skin or toenails below the injury turn blue or gray, or feel cold or numb.   There is a bad smell or new stains and/or purulent (pus like) drainage coming from under the cast.  If you do not have a window in your cast for observing the wound, a discharge or minor bleeding may show up as a stain on the outside of your cast. Report these findings to your caregiver. MAKE SURE YOU:   Understand these instructions.   Will watch your condition.   Will get help right away if you are not doing well or get worse.  Document Released: 08/29/2000 Document Revised: 08/21/2011 Document Reviewed: 04/04/2008 [facility] Patient Information 2012 [place], [place].

## 2012-02-05 NOTE — ED Notes (Signed)
Pt fell of deck about 18 inches and twisted right ankle last night around 7pm. Ankle is swollen and painful. Pulses present.

## 2012-02-07 NOTE — ED Provider Notes (Signed)
Medical screening examination/treatment/procedure(s) were performed by non-physician practitioner and as supervising physician I was immediately available for consultation/collaboration.   Lavon Horn L Briann Sarchet, MD 02/07/12 0734 

## 2012-04-08 ENCOUNTER — Emergency Department (HOSPITAL_BASED_OUTPATIENT_CLINIC_OR_DEPARTMENT_OTHER)
Admission: EM | Admit: 2012-04-08 | Discharge: 2012-04-08 | Disposition: A | Discharge status: Home / Self Care | Payer: Medicare Other | Attending: Emergency Medicine | Admitting: Emergency Medicine

## 2012-04-08 ENCOUNTER — Encounter (HOSPITAL_BASED_OUTPATIENT_CLINIC_OR_DEPARTMENT_OTHER): Payer: Self-pay | Admitting: Emergency Medicine

## 2012-04-08 DIAGNOSIS — Z9089 Acquired absence of other organs: Secondary | ICD-10-CM | POA: Insufficient documentation

## 2012-04-08 DIAGNOSIS — Z8739 Personal history of other diseases of the musculoskeletal system and connective tissue: Secondary | ICD-10-CM | POA: Insufficient documentation

## 2012-04-08 DIAGNOSIS — J4489 Other specified chronic obstructive pulmonary disease: Secondary | ICD-10-CM | POA: Insufficient documentation

## 2012-04-08 DIAGNOSIS — J449 Chronic obstructive pulmonary disease, unspecified: Secondary | ICD-10-CM | POA: Insufficient documentation

## 2012-04-08 DIAGNOSIS — Z7982 Long term (current) use of aspirin: Secondary | ICD-10-CM | POA: Insufficient documentation

## 2012-04-08 DIAGNOSIS — N390 Urinary tract infection, site not specified: Secondary | ICD-10-CM

## 2012-04-08 LAB — URINE MICROSCOPIC-ADD ON

## 2012-04-08 LAB — URINALYSIS, ROUTINE W REFLEX MICROSCOPIC
Glucose, UA: NEGATIVE mg/dL
Ketones, ur: NEGATIVE mg/dL
Protein, ur: NEGATIVE mg/dL
Urobilinogen, UA: 0.2 mg/dL (ref 0.0–1.0)

## 2012-04-08 MED ORDER — SULFAMETHOXAZOLE-TRIMETHOPRIM 800-160 MG PO TABS: 1.0000 | ORAL_TABLET | Freq: Two times a day (BID) | ORAL | Status: AC

## 2012-04-08 NOTE — ED Provider Notes (Signed)
History     CSN: [id number]  Arrival date & time 04/08/12  1850   First MD Initiated Contact with Patient 04/08/12 1913      Chief Complaint  Patient presents with  . Hematuria    (Consider location/radiation/quality/duration/timing/severity/associated sxs/prior treatment) HPI Comments: Pt states that she developed hematuria and pressure today:pt denies any fever, vomiting, diarrhea:pt states that she has had similar symptoms in the past and it was a uti  Patient is a 66 y.o. female presenting with hematuria. The history is provided by the patient. No language interpreter was used.  Hematuria This is a new problem. The current episode started today. The problem is unchanged. The hematuria occurs during the initial portion of her urinary stream. The pain is mild. She describes her urine color as clear. Irritative symptoms include frequency.    Past Medical History  Diagnosis Date  . Arthritis   . COPD (chronic obstructive pulmonary disease)     Past Surgical History  Procedure Date  . Abdominal hysterectomy   . Tonsillectomy   . Appendectomy   . Cervical spine surgery     No family history on file.  History  Substance Use Topics  . Smoking status: Never Smoker   . Smokeless tobacco: Not on file  . Alcohol Use: No    OB History    Grav Para Term Preterm Abortions TAB SAB Ect Mult Living                  Review of Systems  Constitutional: Negative.   Respiratory: Negative.   Cardiovascular: Negative.   Genitourinary: Positive for frequency and hematuria.    Allergies  Morphine and related and Promethazine hcl  Home Medications   Current Outpatient Rx  Name Route Sig Dispense Refill  . ASPIRIN EC 81 MG PO TBEC Oral Take 162 mg by mouth daily.      . OMEGA-3 FATTY ACIDS 1000 MG PO CAPS Oral Take 3 g by mouth daily.     Marland Kitchen FLAX SEED OIL PO Oral Take 2 tablets by mouth daily.     Lowanda Foster EX Apply externally Apply 1 application topically 3 (three)  times daily. To rash at nape of neck      Ht 5\' 3"  (1.6 m)  Wt 150 lb (68.04 kg)  BMI 26.57 kg/m2  Physical Exam  Nursing note and vitals reviewed. Constitutional: She is oriented to person, place, and time. She appears well-developed and well-nourished.  HENT:  Head: Normocephalic and atraumatic.  Eyes: Conjunctivae are normal.  Neck: Neck supple.  Cardiovascular: Normal rate and regular rhythm.   Pulmonary/Chest: Effort normal and breath sounds normal.  Abdominal: Soft. Bowel sounds are normal. There is tenderness.       Suprapubic tenderness  Musculoskeletal: Normal range of motion.  Neurological: She is alert and oriented to person, place, and time.  Skin: Skin is warm and dry.    ED Course  Procedures (including critical care time)  Labs Reviewed  URINALYSIS, ROUTINE W REFLEX MICROSCOPIC - Abnormal; Notable for the following:    Hgb urine dipstick SMALL (*)     Leukocytes, UA MODERATE (*)     All other components within normal limits  URINE MICROSCOPIC-ADD ON  URINE CULTURE   No results found.   1. UTI (lower urinary tract infection)       MDM  Will treat for simple NFA:OZHYQMV sent:abdomen is benign        Teressa Lower, NP 04/08/12 2003

## 2012-04-08 NOTE — ED Notes (Signed)
Pt started having some suprapubic and low back "pressure" and some blood in her urine today. Has hx of same sx once before.

## 2012-04-08 NOTE — ED Provider Notes (Signed)
Medical screening examination/treatment/procedure(s) were performed by non-physician practitioner and as supervising physician I was immediately available for consultation/collaboration.  Martha K Linker, MD 04/08/12 2037 

## 2012-04-10 LAB — URINE CULTURE: Colony Count: 15000

## 2012-09-07 IMAGING — CT CT ANGIO CHEST
2 of 6 series · 18 of 36 positions shown · IV contrast (APPLIED)
Comparison: Chest radiograph 11/21/2010

***ADDENDUM*** CREATED: 11/28/2010 [DATE]

Subsequent to re-review of this examination, there is a minimally
displaced right lateral sixth rib fracture.  This may correlate
with the area of clinical concern and symptoms.  This was
subsequent reported on chest CT 11/28/2010.  The report is
otherwise correct as originally dictated.
CLINICAL DATA: Right-sided chest pain
CT ANGIOGRAPHY CHEST WITH CONTRAST
TECHNIQUE: Multidetector CT imaging of the chest was performed
using the standard protocol during bolus administration of
intravenous contrast.  Multiplanar CT image reconstructions
including MIPs were obtained to evaluate the vascular anatomy.
Contrast:  80 ml Omniscan 350 IV contrast

[Series 5: pe 1.0 b25f · axial · 0.61mm/px · z∈[-262,-56]mm · 17 of 230 slices shown]
[im 12/230  lung]
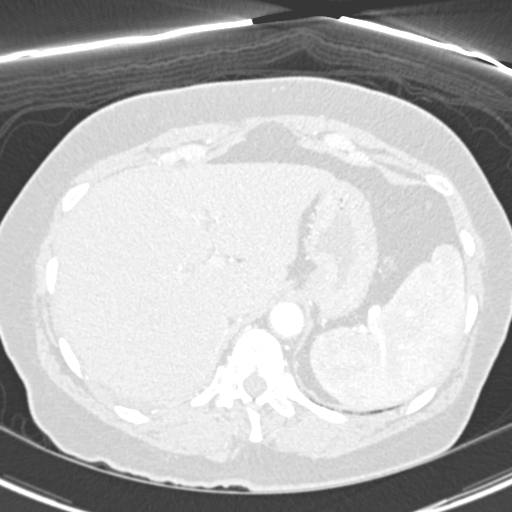
[im 23/230  mediastinal]
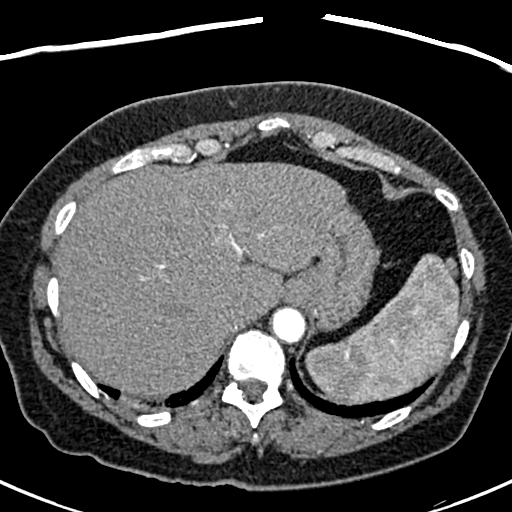
[im 35/230  lung]
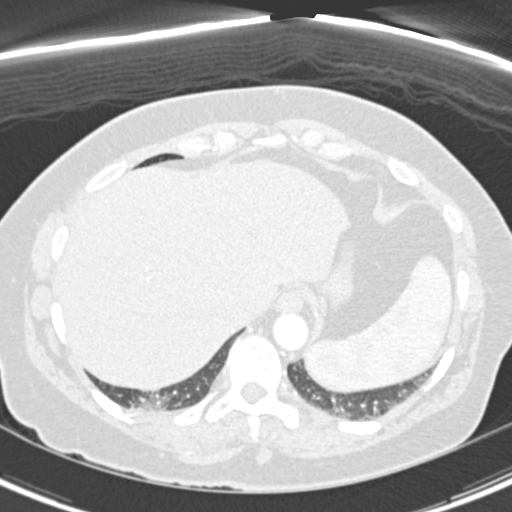
[im 46/230  mediastinal]
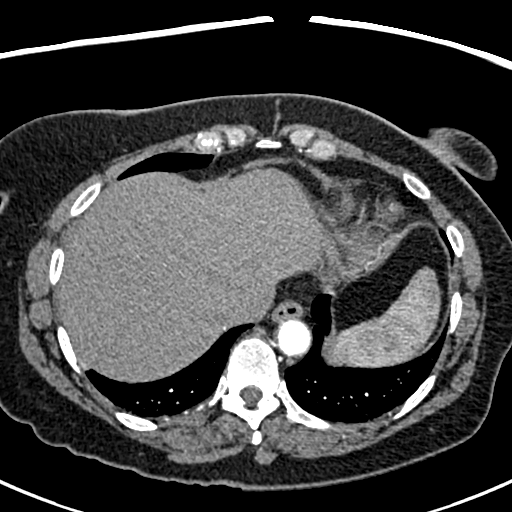
[im 69/230  lung]
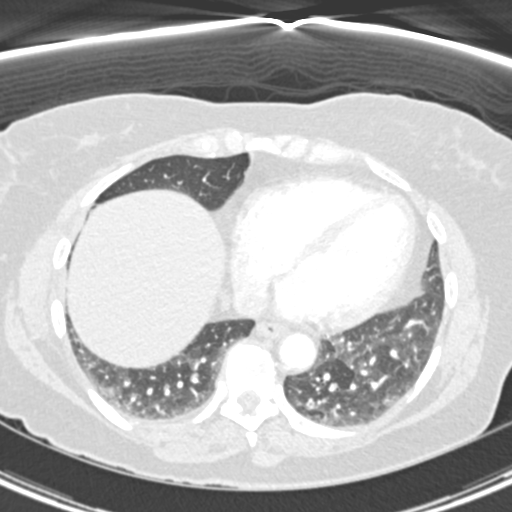
[im 81/230  mediastinal]
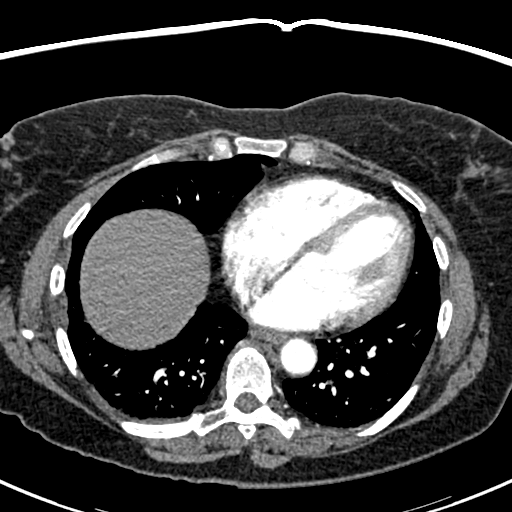
[im 92/230  lung]
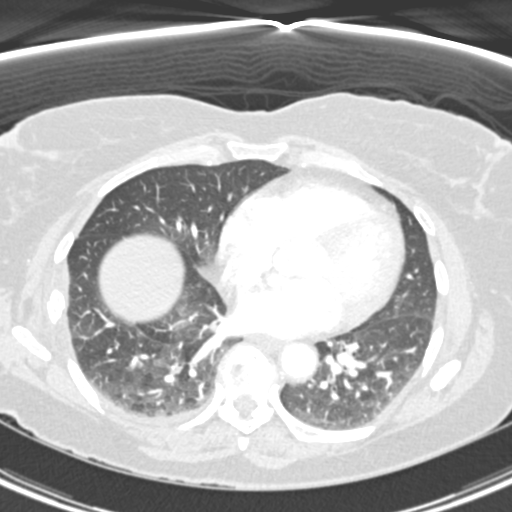
[im 104/230  mediastinal]
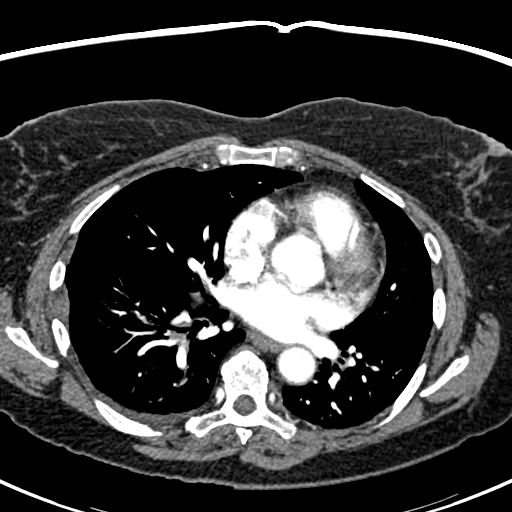
[im 115/230  lung]
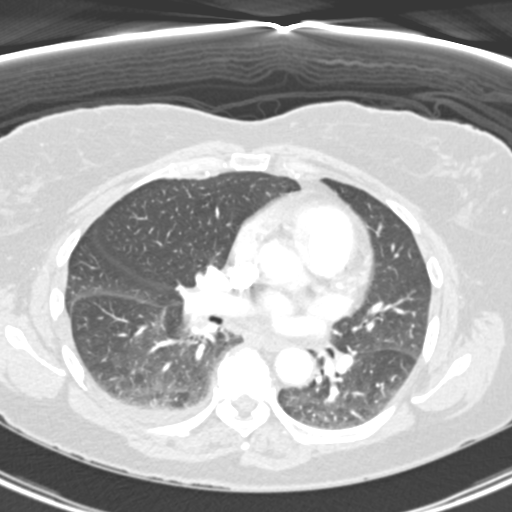
[im 126/230  mediastinal]
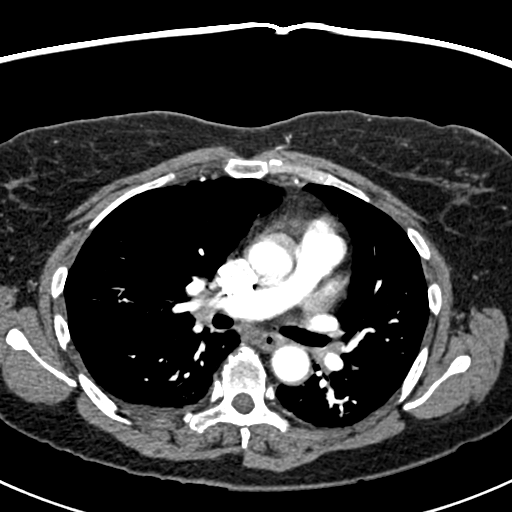
[im 138/230  lung]
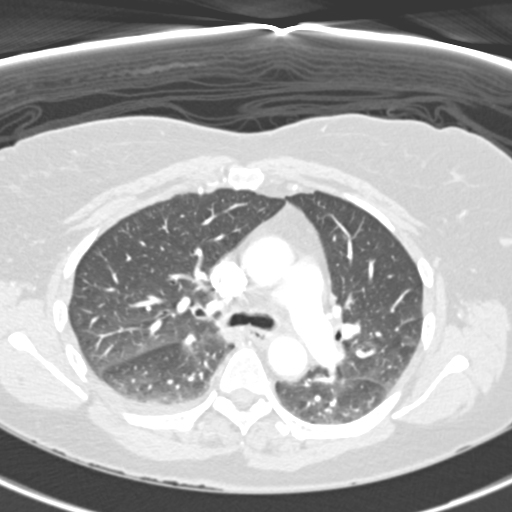
[im 149/230  mediastinal]
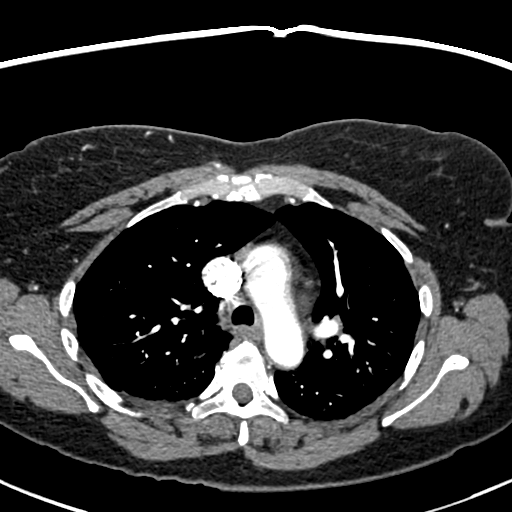
[im 161/230  lung]
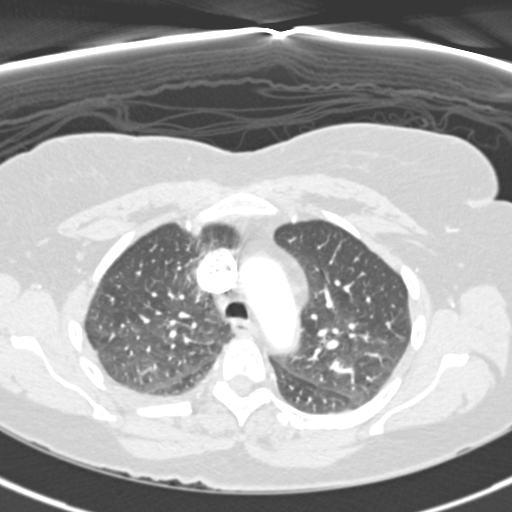
[im 184/230  mediastinal]
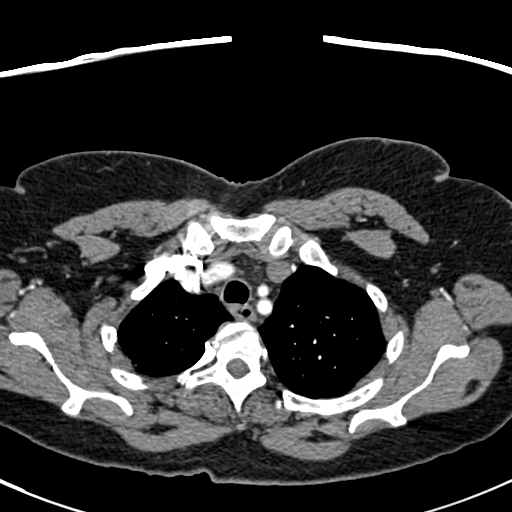
[im 195/230  lung]
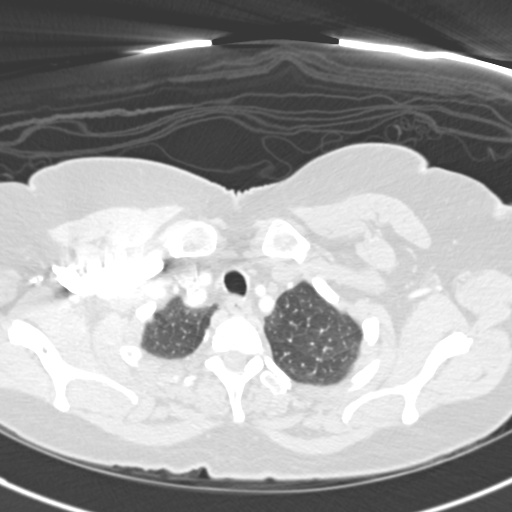
[im 207/230  mediastinal]
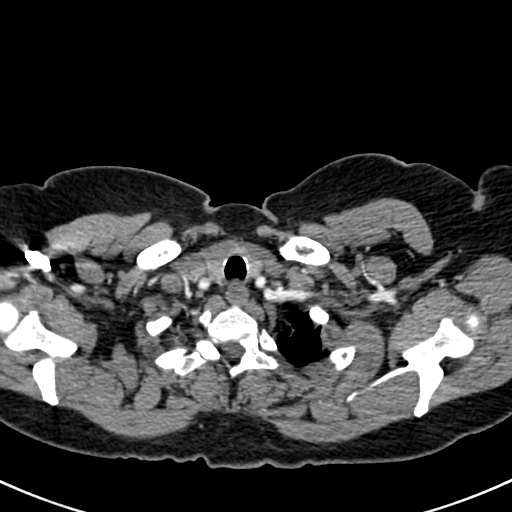
[im 218/230  lung]
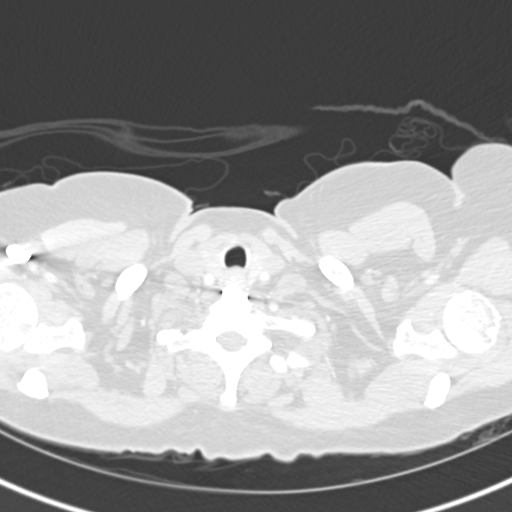

[Series 8: pe 2.0 coronal · coronal · 0.48mm/px · 1 of 104 slices shown]
[im 52/104  mediastinal]
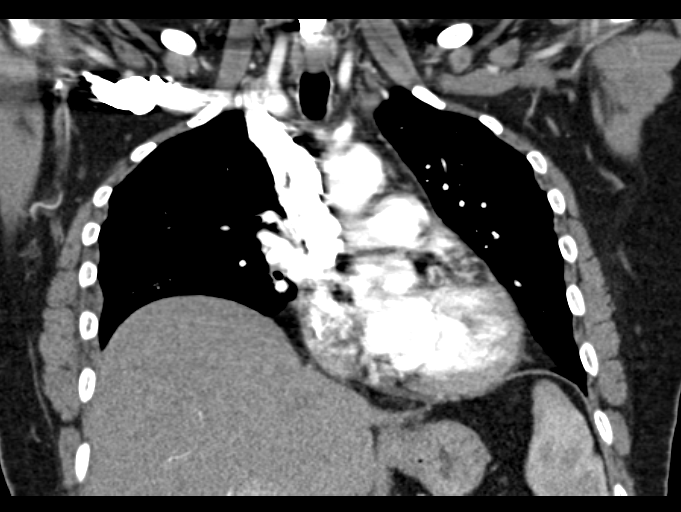

[18 of 36 positions shown; findings below may reference images not displayed]

FINDINGS: The study is of adequate technical quality for
evaluation for pulmonary embolism up to and including the 3rd order
pulmonary arteries. No focal filling defect is seen to suggest
acute pulmonary embolism.  Great vessels are normal in caliber.
Mild prominence of right hilar lymph nodes noted, representative
conglomerate right hilar node measuring 1.2 cm image 35.

Heart size is normal.  Trace right pleural effusion is present.  No
pericardial effusion.

Trace central bronchial wall thickening is noted.  Areas of patchy
ground-glass airspace opacity pattern are noted throughout both
lungs.  No interlobular septal thickening.  3 mm right upper lobe
pleural parenchymal nodule image 19 is unlikely to be clinically
significant.

No acute osseous finding. Cervical fusion hardware partly
visualized.

Review of the MIP images confirms the above findings.
IMPRESSION: Central bronchial wall thickening, trace right effusion, and mildly
prominent right hilar lymph node.  Findings could represent
bronchitis or other small airways infectious etiology.  Reactive
airway disease could have a similar appearance but the presence of
a right effusion makes this less likely.

No specific evidence for pulmonary embolism is identified.

## 2012-10-26 ENCOUNTER — Encounter: Payer: Self-pay | Admitting: Internal Medicine

## 2012-10-26 ENCOUNTER — Encounter (HOSPITAL_BASED_OUTPATIENT_CLINIC_OR_DEPARTMENT_OTHER): Payer: Self-pay

## 2012-10-26 ENCOUNTER — Emergency Department (HOSPITAL_BASED_OUTPATIENT_CLINIC_OR_DEPARTMENT_OTHER)
Admission: EM | Admit: 2012-10-26 | Discharge: 2012-10-26 | Disposition: A | Discharge status: Home / Self Care | Payer: Medicare Other | Attending: Emergency Medicine | Admitting: Emergency Medicine

## 2012-10-26 ENCOUNTER — Emergency Department (HOSPITAL_BASED_OUTPATIENT_CLINIC_OR_DEPARTMENT_OTHER): Payer: Medicare Other

## 2012-10-26 DIAGNOSIS — J069 Acute upper respiratory infection, unspecified: Secondary | ICD-10-CM | POA: Insufficient documentation

## 2012-10-26 DIAGNOSIS — J3489 Other specified disorders of nose and nasal sinuses: Secondary | ICD-10-CM | POA: Insufficient documentation

## 2012-10-26 DIAGNOSIS — J441 Chronic obstructive pulmonary disease with (acute) exacerbation: Secondary | ICD-10-CM | POA: Insufficient documentation

## 2012-10-26 DIAGNOSIS — Z8739 Personal history of other diseases of the musculoskeletal system and connective tissue: Secondary | ICD-10-CM | POA: Insufficient documentation

## 2012-10-26 DIAGNOSIS — R0602 Shortness of breath: Secondary | ICD-10-CM | POA: Insufficient documentation

## 2012-10-26 DIAGNOSIS — Z7982 Long term (current) use of aspirin: Secondary | ICD-10-CM | POA: Insufficient documentation

## 2012-10-26 DIAGNOSIS — J45901 Unspecified asthma with (acute) exacerbation: Secondary | ICD-10-CM | POA: Insufficient documentation

## 2012-10-26 MED ORDER — ALBUTEROL SULFATE HFA 108 (90 BASE) MCG/ACT IN AERS
1.0000 | INHALATION_SPRAY | RESPIRATORY_TRACT | Status: DC | PRN
  Administered 2012-10-26: 2 via RESPIRATORY_TRACT
  Filled 2012-10-26: qty 6.7

## 2012-10-26 MED ORDER — PREDNISONE 20 MG PO TABS: 40.0000 mg | ORAL_TABLET | Freq: Every day | ORAL | Status: DC

## 2012-10-26 MED ORDER — PREDNISONE 50 MG PO TABS
60.0000 mg | ORAL_TABLET | Freq: Once | ORAL | Status: AC
  Administered 2012-10-26: 60 mg via ORAL
  Filled 2012-10-26: qty 1

## 2012-10-26 MED ORDER — LEVOFLOXACIN 500 MG PO TABS: 500.0000 mg | ORAL_TABLET | Freq: Every day | ORAL | Status: DC

## 2012-10-26 NOTE — ED Provider Notes (Addendum)
History     CSN: [id number]  Arrival date & time 10/26/12  1549   First MD Initiated Contact with Patient 10/26/12 1620      Chief Complaint  Patient presents with  . Cough    (Consider location/radiation/quality/duration/timing/severity/associated sxs/prior treatment) Patient is a 67 y.o. female presenting with cough. The history is provided by the patient.  Cough Cough characteristics:  Non-productive Severity:  Severe Onset quality:  Gradual Timing:  Constant Progression:  Worsening Chronicity:  Recurrent Smoker: no   Context: upper respiratory infection   Relieved by:  Nothing (in the past qvar inhaler has worked but she ran out) Worsened by:  Activity Ineffective treatments:  Decongestant Associated symptoms: rhinorrhea, shortness of breath, sinus congestion and wheezing   Associated symptoms: no chest pain, no fever and no rash   Risk factors: recent infection     Past Medical History  Diagnosis Date  . Arthritis   . COPD (chronic obstructive pulmonary disease)     Past Surgical History  Procedure Laterality Date  . Abdominal hysterectomy    . Tonsillectomy    . Appendectomy    . Cervical spine surgery      No family history on file.  History  Substance Use Topics  . Smoking status: Never Smoker   . Smokeless tobacco: Not on file  . Alcohol Use: No    OB History   Grav Para Term Preterm Abortions TAB SAB Ect Mult Living                  Review of Systems  Constitutional: Negative for fever.  HENT: Positive for rhinorrhea.   Respiratory: Positive for cough, shortness of breath and wheezing.   Cardiovascular: Negative for chest pain.  Skin: Negative for rash.  All other systems reviewed and are negative.    Allergies  Morphine and related and Promethazine hcl  Home Medications   Current Outpatient Rx  Name  Route  Sig  Dispense  Refill  . aspirin EC 81 MG tablet   Oral   Take 162 mg by mouth daily.           . fish oil-omega-3  fatty acids 1000 MG capsule   Oral   Take 3 g by mouth daily.          . Flaxseed, Linseed, (FLAX SEED OIL PO)   Oral   Take 2 tablets by mouth daily.          . Hydrocortisone (CORTIZONE-10 EX)   Apply externally   Apply 1 application topically 3 (three) times daily. To rash at nape of neck           BP 105/91  Pulse 109  Temp(Src) 99.1 F (37.3 C)  Ht 5\' 3"  (1.6 m)  Wt 154 lb (69.854 kg)  BMI 27.29 kg/m2  SpO2 98%  Physical Exam  Nursing note and vitals reviewed. Constitutional: She is oriented to person, place, and time. She appears well-developed and well-nourished. No distress.  HENT:  Head: Normocephalic and atraumatic.  Right Ear: Tympanic membrane and ear canal normal.  Left Ear: Tympanic membrane and ear canal normal.  Nose: Mucosal edema and rhinorrhea present.  Eyes: EOM are normal. Pupils are equal, round, and reactive to light.  Cardiovascular: Regular rhythm, normal heart sounds and intact distal pulses.  Tachycardia present.  Exam reveals no friction rub.   No murmur heard. Pulmonary/Chest: Effort normal and breath sounds normal. She has no wheezes. She has no rales.  Abdominal:  Soft. Bowel sounds are normal. She exhibits no distension. There is no tenderness. There is no rebound and no guarding.  Musculoskeletal: Normal range of motion. She exhibits no tenderness.  No edema  Neurological: She is alert and oriented to person, place, and time. No cranial nerve deficit.  Skin: Skin is warm and dry. No rash noted.  Psychiatric: She has a normal mood and affect. Her behavior is normal.    ED Course  Procedures (including critical care time)  Labs Reviewed - No data to display Dg Chest 2 View  10/26/2012  *RADIOLOGY REPORT*  Clinical Data: Cough, congestion  CHEST - 2 VIEW  Comparison:  11/12/2011  Findings: Cardiomediastinal silhouette is stable.  Elevation of the right hemidiaphragm again noted.  No acute infiltrate or pleural effusion.  No pulmonary  edema.  Mild degenerative changes thoracic spine.  IMPRESSION: No active disease.  No significant change.   Original Report Authenticated By: Natasha Mead, M.D.      1. URI, acute       MDM   Pt with symptoms consistent with viral URI/ bronchitis with hx in the past of severe PNA.  Well appearing here.  No signs of breathing difficulty but persistent cough.  No signs of pharyngitis, otitis or abnormal abdominal findings.   CXR wnl and pt to return with any further problems.  Pt d/ced with levaquin, albuterol, prednisone         Gwyneth Sprout, MD 10/26/12 1639  Gwyneth Sprout, MD 10/26/12 1641

## 2012-10-26 NOTE — ED Notes (Signed)
RT Note: Patient was instructed on proper MDI use with spacer. Patient demonstrated technique well and has no questions. Patient is in no acute distress currently and RT will continue to monitor.

## 2012-10-26 NOTE — ED Notes (Signed)
MD at bedside. 

## 2012-10-26 NOTE — ED Notes (Signed)
C/o cough, chest congestion x 3 days

## 2012-11-01 ENCOUNTER — Emergency Department (HOSPITAL_BASED_OUTPATIENT_CLINIC_OR_DEPARTMENT_OTHER): Payer: Medicare Other

## 2012-11-01 ENCOUNTER — Encounter (HOSPITAL_BASED_OUTPATIENT_CLINIC_OR_DEPARTMENT_OTHER): Payer: Self-pay | Admitting: *Deleted

## 2012-11-01 ENCOUNTER — Emergency Department (HOSPITAL_BASED_OUTPATIENT_CLINIC_OR_DEPARTMENT_OTHER)
Admission: EM | Admit: 2012-11-01 | Discharge: 2012-11-01 | Disposition: A | Discharge status: Home / Self Care | Payer: Medicare Other | Attending: Emergency Medicine | Admitting: Emergency Medicine

## 2012-11-01 DIAGNOSIS — Z7982 Long term (current) use of aspirin: Secondary | ICD-10-CM | POA: Insufficient documentation

## 2012-11-01 DIAGNOSIS — J4489 Other specified chronic obstructive pulmonary disease: Secondary | ICD-10-CM | POA: Insufficient documentation

## 2012-11-01 DIAGNOSIS — J3489 Other specified disorders of nose and nasal sinuses: Secondary | ICD-10-CM | POA: Insufficient documentation

## 2012-11-01 DIAGNOSIS — J40 Bronchitis, not specified as acute or chronic: Secondary | ICD-10-CM

## 2012-11-01 DIAGNOSIS — Z8739 Personal history of other diseases of the musculoskeletal system and connective tissue: Secondary | ICD-10-CM | POA: Insufficient documentation

## 2012-11-01 DIAGNOSIS — R071 Chest pain on breathing: Secondary | ICD-10-CM | POA: Insufficient documentation

## 2012-11-01 DIAGNOSIS — Z79899 Other long term (current) drug therapy: Secondary | ICD-10-CM | POA: Insufficient documentation

## 2012-11-01 DIAGNOSIS — J449 Chronic obstructive pulmonary disease, unspecified: Secondary | ICD-10-CM | POA: Insufficient documentation

## 2012-11-01 DIAGNOSIS — R0789 Other chest pain: Secondary | ICD-10-CM

## 2012-11-01 MED ORDER — BECLOMETHASONE DIPROPIONATE 80 MCG/ACT IN AERS: 1.0000 | INHALATION_SPRAY | RESPIRATORY_TRACT | Status: DC | PRN

## 2012-11-01 MED ORDER — KETOROLAC TROMETHAMINE 30 MG/ML IJ SOLN
30.0000 mg | Freq: Once | INTRAMUSCULAR | Status: AC
  Administered 2012-11-01: 30 mg via INTRAMUSCULAR
  Filled 2012-11-01: qty 1

## 2012-11-01 MED ORDER — HYDROCODONE-ACETAMINOPHEN 7.5-500 MG/15ML PO SOLN: 15.0000 mL | Freq: Four times a day (QID) | ORAL | Status: DC | PRN

## 2012-11-01 MED ORDER — PREDNISONE 20 MG PO TABS: 60.0000 mg | ORAL_TABLET | Freq: Every day | ORAL | Status: AC

## 2012-11-01 MED ORDER — ALBUTEROL SULFATE HFA 108 (90 BASE) MCG/ACT IN AERS: 1.0000 | INHALATION_SPRAY | Freq: Four times a day (QID) | RESPIRATORY_TRACT | Status: DC | PRN

## 2012-11-01 NOTE — ED Provider Notes (Signed)
History  This chart was scribed for Gerhard Munch, MD by Bennett Scrape, ED Scribe. This patient was seen in room MH02/MH02 and the patient's care was started at 4:43 PM.  CSN: [id number]  Arrival date & time 11/01/12  1620   First MD Initiated Contact with Patient 11/01/12 1643      Chief Complaint  Patient presents with  . Cough     The history is provided by the patient. No language interpreter was used.   [name] is a 67 y.o. female with a h/o COPD who presents to the Emergency Department complaining of one week of gradual onset, non-changing, constant non-productive cough with associated posterior and anterior left rib cage that is worse with coughing. She was seen last week in the ED for the same and reports finishing the prednisone and levaquin prescriptions with mild improvement. She states that she has been using an albuterol inhaler and mucinex with mild improvement. She reports that she used to have a prescription for Qvair for her COPD but has not been on the medication for the past month due to lack of a PCP. She denies any recent fevers, chills, confusion, SOB, CP and nausea as associated symptoms. She denies having a h/o heart disease and she denies smoking and alcohol use.  She states that she has an appointment with a new PCP on March 13th.  Past Medical History  Diagnosis Date  . Arthritis   . COPD (chronic obstructive pulmonary disease)     Past Surgical History  Procedure Laterality Date  . Abdominal hysterectomy    . Tonsillectomy    . Appendectomy    . Cervical spine surgery      History reviewed. No pertinent family history.  History  Substance Use Topics  . Smoking status: Never Smoker   . Smokeless tobacco: Not on file  . Alcohol Use: No    No OB history provided.  Review of Systems  Constitutional:       Per HPI, otherwise negative  HENT:       Per HPI, otherwise negative  Respiratory:       Per HPI, otherwise negative   Cardiovascular:       Per HPI, otherwise negative  Gastrointestinal: Negative for vomiting.  Endocrine:       Negative aside from HPI  Genitourinary:       Neg aside from HPI   Musculoskeletal:       Per HPI, otherwise negative  Skin: Negative.   Neurological: Negative for syncope.    Allergies  Morphine and related and Promethazine hcl  Home Medications   Current Outpatient Rx  Name  Route  Sig  Dispense  Refill  . aspirin EC 81 MG tablet   Oral   Take 162 mg by mouth daily.           . fish oil-omega-3 fatty acids 1000 MG capsule   Oral   Take 3 g by mouth daily.          . Flaxseed, Linseed, (FLAX SEED OIL PO)   Oral   Take 2 tablets by mouth daily.          . Hydrocortisone (CORTIZONE-10 EX)   Apply externally   Apply 1 application topically 3 (three) times daily. To rash at nape of neck         . levofloxacin (LEVAQUIN) 500 MG tablet   Oral   Take 1 tablet (500 mg total) by mouth daily.  7 tablet   0   . predniSONE (DELTASONE) 20 MG tablet   Oral   Take 2 tablets (40 mg total) by mouth daily.   10 tablet   0     Triage Vitals: BP 148/80  Pulse 97  Temp(Src) 98.1 F (36.7 C) (Oral)  Resp 16  Ht 5\' 3"  (1.6 m)  Wt 154 lb (69.854 kg)  BMI 27.29 kg/m2  SpO2 97%  Physical Exam  Nursing note and vitals reviewed. Constitutional: She is oriented to person, place, and time. She appears well-developed and well-nourished. No distress.  HENT:  Head: Normocephalic and atraumatic.  Eyes: Conjunctivae and EOM are normal.  Neck: Neck supple. No tracheal deviation present.  Cardiovascular: Normal rate and regular rhythm.   Pulmonary/Chest: Effort normal and breath sounds normal. No stridor. No respiratory distress. She has no wheezes. She exhibits no tenderness.  Abdominal: She exhibits no distension.  Musculoskeletal: Normal range of motion. She exhibits no edema.  Neurological: She is alert and oriented to person, place, and time. No cranial  nerve deficit.  Skin: Skin is warm and dry.  Psychiatric: She has a normal mood and affect. Her behavior is normal.    ED Course  Procedures (including critical care time)  DIAGNOSTIC STUDIES: Oxygen Saturation is 97% on room air, adequate by my interpretation.    COORDINATION OF CARE: 4:53 PM- Advised pt to switch to mucinex DM and I will refill her Qvair and albuterol inhaler prescriptions. Discussed treatment plan which includes CXR with pt at bedside and pt agreed to plan. Pt states that she is driving so advised pt that she will not receive narcotics in the ED. Pt verbalized her understanding  5:32 PM- Informed pt of radiology report and pt verbalized understanding. Discussed discharge plan as above along with narcotic cough medication with pt and pt agreed.  Labs Reviewed - No data to display Dg Chest 2 View  11/01/2012  *RADIOLOGY REPORT*  Clinical Data: Cough, congestion, rib pain  CHEST - 2 VIEW  Comparison: Chest x-ray of 10/26/2012  Findings: No active infiltrate or effusion is seen.  Very minimal peribronchial thickening is noted.  The heart is within normal limits in size.  No skeletal abnormality is seen.  IMPRESSION:   No active lung disease.  Question bronchitis.   Original Report Authenticated By: Dwyane Dee, M.D.      No diagnosis found.    MDM   I personally performed the services described in this documentation, which was scribed in my presence. The recorded information has been reviewed and is accurate.   This patient with COPD, completing a course of antibiotics, steroids now presents with ongoing left lower rib cage pain on exam the patient is afebrile, and in no distress.  Given the patient's history of COPD, her cough, there suspicion for infectious etiology, though muscle strain or rib fracture are considerations.  The patient's x-ray does not demonstrate pneumonia, but there is evidence of bronchitic changes.  The patient was advised to continue the steroid,  analgesia regimens, and advised to use her incentive parameter.  Absent fever, with no evidence of pneumonia, antibiotics will be completed, but not extended.  Gerhard Munch, MD 11/01/12 [phone]

## 2012-11-01 NOTE — ED Notes (Signed)
Pt states she was recently seen and treated and states still has the cough and pain under left breast and rib cage with cough

## 2012-11-02 ENCOUNTER — Emergency Department (HOSPITAL_BASED_OUTPATIENT_CLINIC_OR_DEPARTMENT_OTHER)
Admission: EM | Admit: 2012-11-02 | Discharge: 2012-11-02 | Disposition: A | Discharge status: Home / Self Care | Payer: Medicare Other | Attending: Emergency Medicine | Admitting: Emergency Medicine

## 2012-11-02 ENCOUNTER — Encounter (HOSPITAL_BASED_OUTPATIENT_CLINIC_OR_DEPARTMENT_OTHER): Payer: Self-pay | Admitting: *Deleted

## 2012-11-02 DIAGNOSIS — R05 Cough: Secondary | ICD-10-CM

## 2012-11-02 DIAGNOSIS — R059 Cough, unspecified: Secondary | ICD-10-CM | POA: Insufficient documentation

## 2012-11-02 DIAGNOSIS — J40 Bronchitis, not specified as acute or chronic: Secondary | ICD-10-CM | POA: Insufficient documentation

## 2012-11-02 DIAGNOSIS — J4489 Other specified chronic obstructive pulmonary disease: Secondary | ICD-10-CM | POA: Insufficient documentation

## 2012-11-02 DIAGNOSIS — M129 Arthropathy, unspecified: Secondary | ICD-10-CM | POA: Insufficient documentation

## 2012-11-02 DIAGNOSIS — Z79899 Other long term (current) drug therapy: Secondary | ICD-10-CM | POA: Insufficient documentation

## 2012-11-02 DIAGNOSIS — IMO0002 Reserved for concepts with insufficient information to code with codable children: Secondary | ICD-10-CM | POA: Insufficient documentation

## 2012-11-02 DIAGNOSIS — J069 Acute upper respiratory infection, unspecified: Secondary | ICD-10-CM | POA: Insufficient documentation

## 2012-11-02 DIAGNOSIS — Z7982 Long term (current) use of aspirin: Secondary | ICD-10-CM | POA: Insufficient documentation

## 2012-11-02 DIAGNOSIS — J449 Chronic obstructive pulmonary disease, unspecified: Secondary | ICD-10-CM | POA: Insufficient documentation

## 2012-11-02 NOTE — ED Notes (Signed)
Sore throat. Was seen here yesterday for bronchitis. States she is no better.

## 2012-11-02 NOTE — ED Provider Notes (Signed)
History     CSN: [id number]  Arrival date & time 11/02/12  1842   First MD Initiated Contact with Patient 11/02/12 1858      Chief Complaint  Patient presents with  . Sore Throat    (Consider location/radiation/quality/duration/timing/severity/associated sxs/prior treatment) HPI Comments: Pt states that she has had a cough and sorethroat for 2 weeks:pt states that she was given and antibiotic and told that it was a uri and she states the antibiotics didn't get any better:pt states that she was then seen again yesterday here and was told it was bronchitis:pt states that she came back because it was not better and cough was not productive  The history is provided by the patient. No language interpreter was used.    Past Medical History  Diagnosis Date  . Arthritis   . COPD (chronic obstructive pulmonary disease)     Past Surgical History  Procedure Laterality Date  . Abdominal hysterectomy    . Tonsillectomy    . Appendectomy    . Cervical spine surgery      No family history on file.  History  Substance Use Topics  . Smoking status: Never Smoker   . Smokeless tobacco: Not on file  . Alcohol Use: No    OB History   Grav Para Term Preterm Abortions TAB SAB Ect Mult Living                  Review of Systems  Constitutional: Negative.   Respiratory: Positive for cough.   Cardiovascular: Negative.     Allergies  Morphine and related and Promethazine hcl  Home Medications   Current Outpatient Rx  Name  Route  Sig  Dispense  Refill  . albuterol (PROVENTIL HFA;VENTOLIN HFA) 108 (90 BASE) MCG/ACT inhaler   Inhalation   Inhale 1-2 puffs into the lungs every 6 (six) hours as needed for wheezing.   1 Inhaler   1   . aspirin EC 81 MG tablet   Oral   Take 162 mg by mouth daily.           . beclomethasone (QVAR) 80 MCG/ACT inhaler   Inhalation   Inhale 1 puff into the lungs as needed.   1 Inhaler   1   . fish oil-omega-3 fatty acids 1000 MG capsule  Oral   Take 3 g by mouth daily.          . Flaxseed, Linseed, (FLAX SEED OIL PO)   Oral   Take 2 tablets by mouth daily.          Marland Kitchen HYDROcodone-acetaminophen (LORTAB) 7.5-500 MG/15ML solution   Oral   Take 15 mLs by mouth every 6 (six) hours as needed for pain.   120 mL   0   . Hydrocortisone (CORTIZONE-10 EX)   Apply externally   Apply 1 application topically 3 (three) times daily. To rash at nape of neck         . levofloxacin (LEVAQUIN) 500 MG tablet   Oral   Take 1 tablet (500 mg total) by mouth daily.   7 tablet   0   . predniSONE (DELTASONE) 20 MG tablet   Oral   Take 3 tablets (60 mg total) by mouth daily.   12 tablet   0     BP 142/75  Pulse 105  Temp(Src) 98.6 F (37 C) (Oral)  Resp 20  SpO2 98%  Physical Exam  Nursing note and vitals reviewed. Constitutional: She is oriented to  person, place, and time. She appears well-developed and well-nourished.  HENT:  Head: Normocephalic and atraumatic.  Right Ear: External ear normal.  Eyes: Conjunctivae and EOM are normal. Pupils are equal, round, and reactive to light.  Neck: Normal range of motion. Neck supple.  Cardiovascular: Normal rate and regular rhythm.   Pulmonary/Chest: Effort normal and breath sounds normal. No respiratory distress. She has no wheezes.  Abdominal: Soft. Bowel sounds are normal. There is no tenderness.  Musculoskeletal: Normal range of motion.  Neurological: She is alert and oriented to person, place, and time.  Skin: Skin is warm and dry.  Psychiatric: She has a normal mood and affect.    ED Course  Procedures (including critical care time)  Labs Reviewed - No data to display Dg Chest 2 View  11/01/2012  *RADIOLOGY REPORT*  Clinical Data: Cough, congestion, rib pain  CHEST - 2 VIEW  Comparison: Chest x-ray of 10/26/2012  Findings: No active infiltrate or effusion is seen.  Very minimal peribronchial thickening is noted.  The heart is within normal limits in size.  No  skeletal abnormality is seen.  IMPRESSION:   No active lung disease.  Question bronchitis.   Original Report Authenticated By: Dwyane Dee, M.D.      1. Cough   2. Bronchitis       MDM  Pt had x-ray yesterday and was given prednisone and hydrocodone and she is not better today so she came back in:pt denies fever or change in symptoms:pt states that the pain has resolved        Teressa Lower, NP 11/02/12 1924

## 2012-11-02 NOTE — ED Provider Notes (Signed)
Medical screening examination/treatment/procedure(s) were performed by non-physician practitioner and as supervising physician I was immediately available for consultation/collaboration.   Gwyneth Sprout, MD 11/02/12 [phone]

## 2012-11-16 ENCOUNTER — Ambulatory Visit: Payer: Medicare Other | Admitting: Internal Medicine

## 2012-12-17 ENCOUNTER — Ambulatory Visit: Payer: Medicare Other | Admitting: Internal Medicine

## 2013-06-14 ENCOUNTER — Ambulatory Visit: Payer: Medicare Other | Admitting: Internal Medicine

## 2013-06-17 ENCOUNTER — Ambulatory Visit: Payer: Medicare Other | Admitting: Internal Medicine

## 2013-08-27 IMAGING — CR DG CHEST 2V
2 series · 2 of 2 positions shown · non-contrast
Comparison: 08/29/2011

CLINICAL DATA: Chest discomfort and dizziness.

CHEST - 2 VIEW

[w chest pa *]
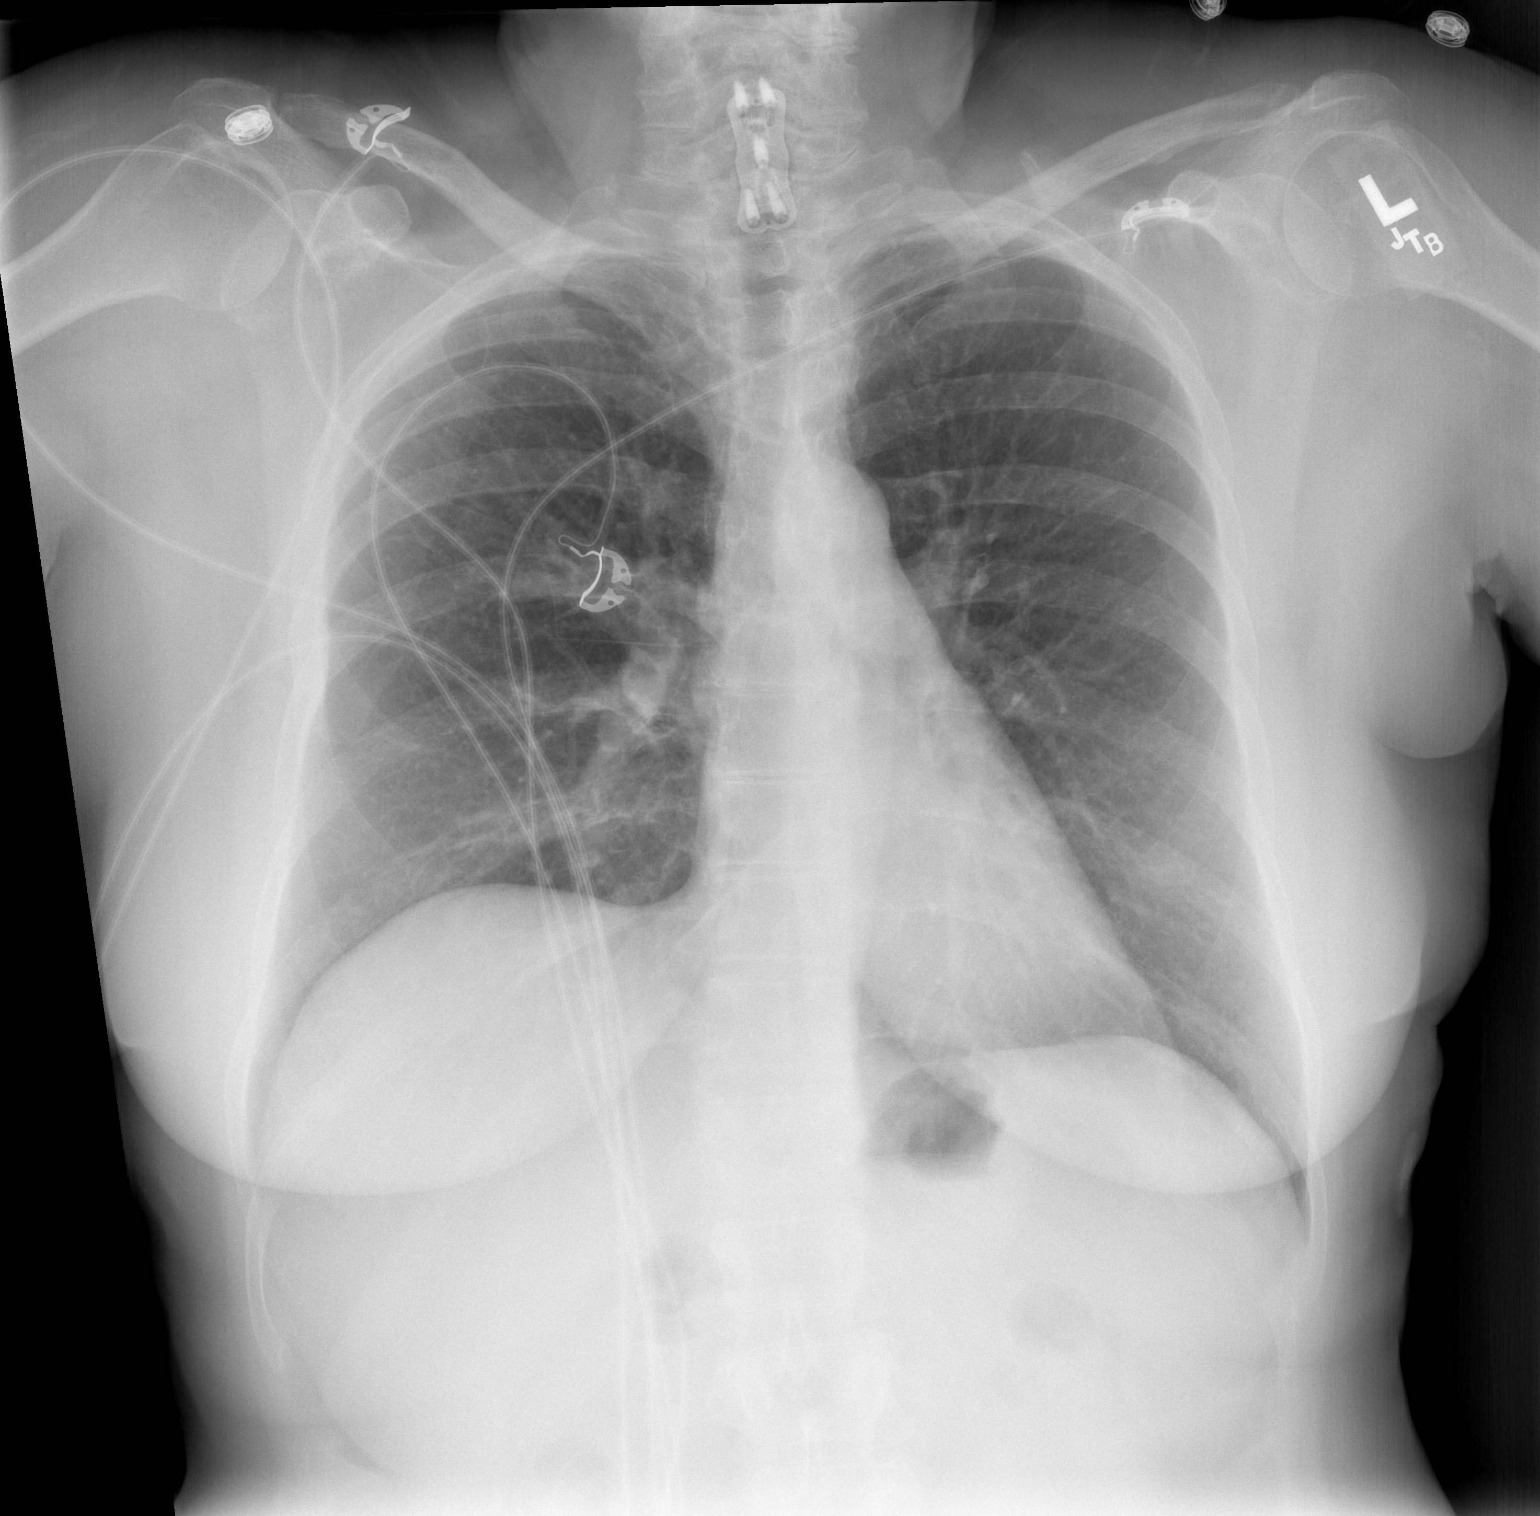

[w chest lat *]
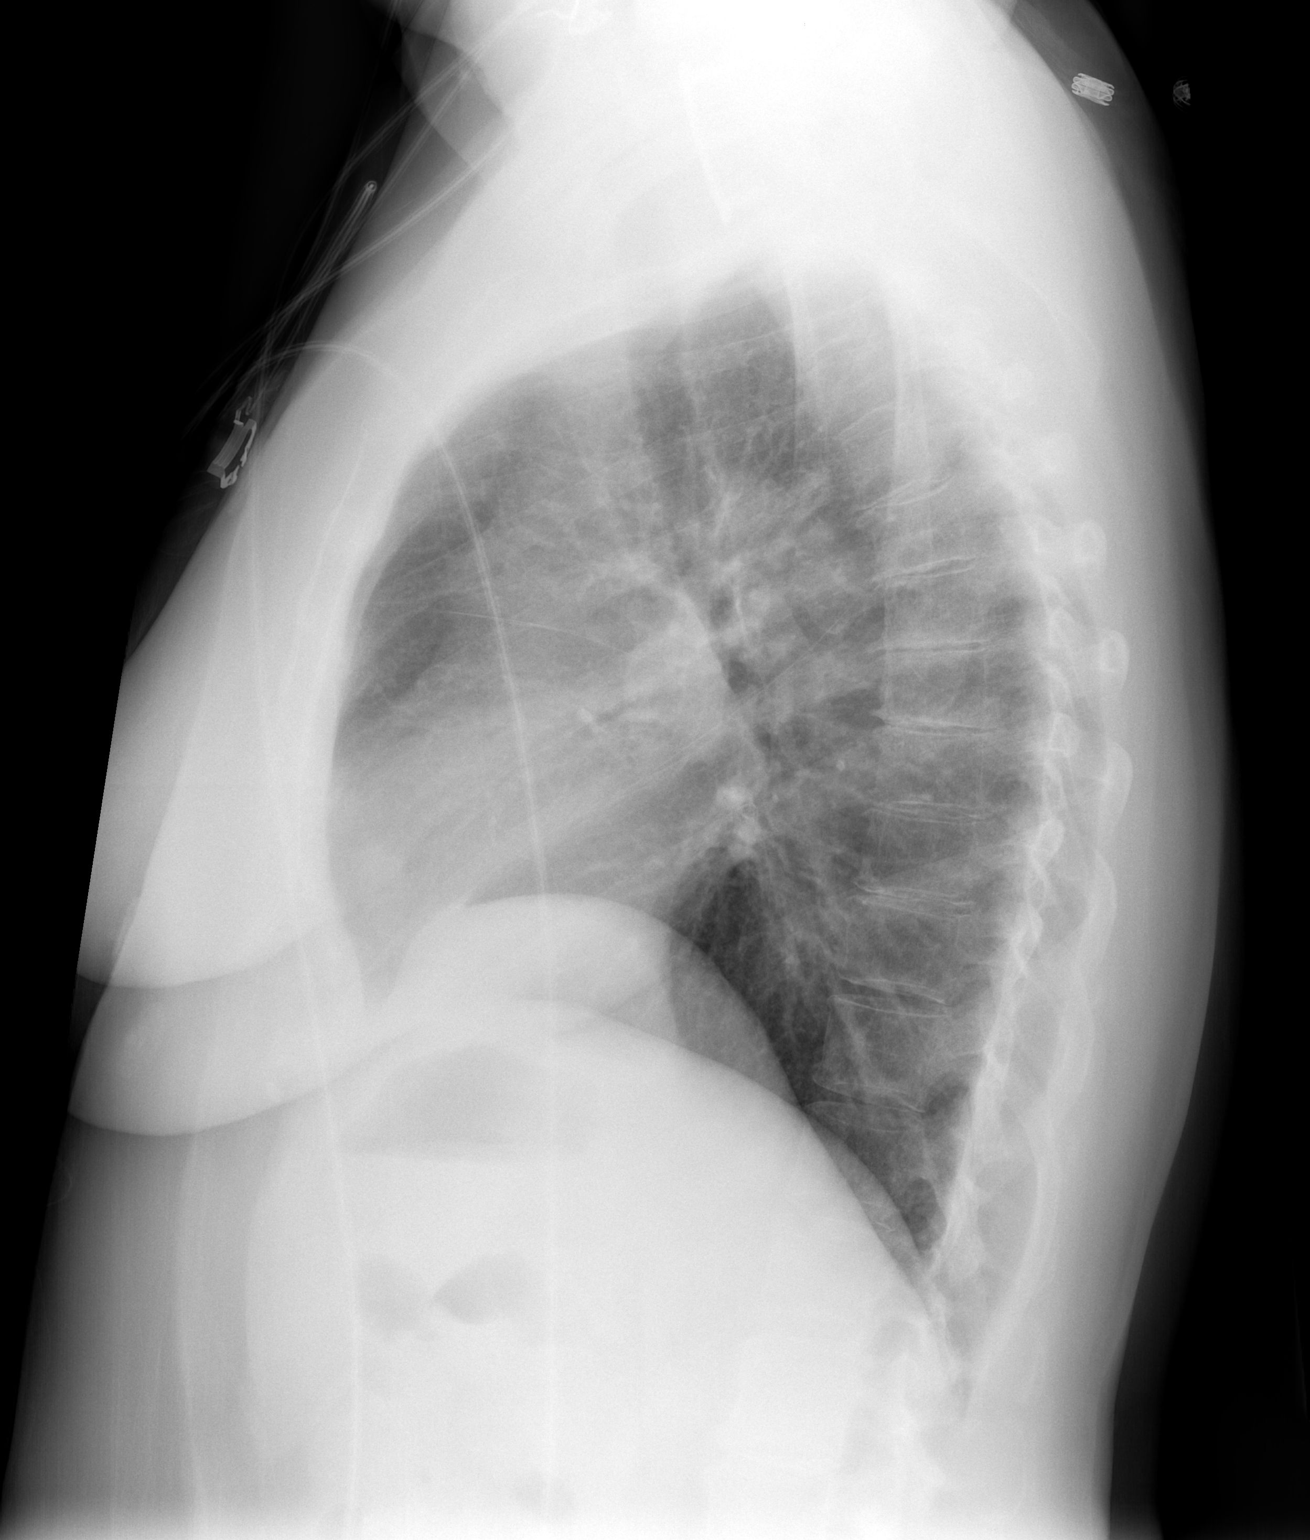

[2 of 2 positions shown; findings below may reference images not displayed]

FINDINGS: Lungs are clear.  Stable mild elevation of the right
hemidiaphragm.  No edema, consolidation or pleural fluid
identified.  Heart size and mediastinal contours are within normal
limits.  Cervical fusion plate again noted.  Mild degenerative
changes present throughout the thoracic spine.
IMPRESSION: No active disease.

## 2013-09-12 ENCOUNTER — Emergency Department (HOSPITAL_BASED_OUTPATIENT_CLINIC_OR_DEPARTMENT_OTHER)
Admission: EM | Admit: 2013-09-12 | Discharge: 2013-09-12 | Disposition: A | Discharge status: Home / Self Care | Payer: Medicare Other | Attending: Emergency Medicine | Admitting: Emergency Medicine

## 2013-09-12 ENCOUNTER — Encounter (HOSPITAL_BASED_OUTPATIENT_CLINIC_OR_DEPARTMENT_OTHER): Payer: Self-pay | Admitting: Emergency Medicine

## 2013-09-12 DIAGNOSIS — J449 Chronic obstructive pulmonary disease, unspecified: Secondary | ICD-10-CM | POA: Insufficient documentation

## 2013-09-12 DIAGNOSIS — Z7982 Long term (current) use of aspirin: Secondary | ICD-10-CM | POA: Insufficient documentation

## 2013-09-12 DIAGNOSIS — IMO0002 Reserved for concepts with insufficient information to code with codable children: Secondary | ICD-10-CM | POA: Insufficient documentation

## 2013-09-12 DIAGNOSIS — L089 Local infection of the skin and subcutaneous tissue, unspecified: Secondary | ICD-10-CM | POA: Insufficient documentation

## 2013-09-12 DIAGNOSIS — Z8619 Personal history of other infectious and parasitic diseases: Secondary | ICD-10-CM | POA: Insufficient documentation

## 2013-09-12 DIAGNOSIS — Z79899 Other long term (current) drug therapy: Secondary | ICD-10-CM | POA: Insufficient documentation

## 2013-09-12 DIAGNOSIS — J4489 Other specified chronic obstructive pulmonary disease: Secondary | ICD-10-CM | POA: Insufficient documentation

## 2013-09-12 DIAGNOSIS — M129 Arthropathy, unspecified: Secondary | ICD-10-CM | POA: Insufficient documentation

## 2013-09-12 HISTORY — DX: Zoster without complications: B02.9

## 2013-09-12 MED ORDER — CEPHALEXIN 500 MG PO CAPS: 500.0000 mg | ORAL_CAPSULE | Freq: Four times a day (QID) | ORAL | Status: DC

## 2013-09-12 NOTE — ED Notes (Signed)
D/c home with rx x 1 for keflex

## 2013-09-12 NOTE — ED Provider Notes (Signed)
CSN: [id number]     Arrival date & time 09/12/13  5621 History   First MD Initiated Contact with Patient 09/12/13 0414     Chief Complaint  Patient presents with  . Wound Check   (Consider location/radiation/quality/duration/timing/severity/associated sxs/prior Treatment) Patient is a 67 y.o. female presenting with rash. The history is provided by the patient.  Rash Location:  Head/neck and torso Head/neck rash location:  Head Torso rash location:  R axilla Quality: redness   Quality: not blistering, not draining, not peeling, not swelling and not weeping   Severity:  Mild Onset quality:  Gradual Duration:  7 days Timing:  Constant Progression:  Unchanged Chronicity:  New Context: not animal contact   Relieved by:  Nothing Worsened by:  Nothing tried Ineffective treatments:  None tried Associated symptoms: no fever     Past Medical History  Diagnosis Date  . Arthritis   . COPD (chronic obstructive pulmonary disease)   . Shingles    Past Surgical History  Procedure Laterality Date  . Abdominal hysterectomy    . Tonsillectomy    . Appendectomy    . Cervical spine surgery     No family history on file. History  Substance Use Topics  . Smoking status: Never Smoker   . Smokeless tobacco: Not on file  . Alcohol Use: No   OB History   Grav Para Term Preterm Abortions TAB SAB Ect Mult Living                 Review of Systems  Constitutional: Negative for fever.  Skin: Positive for rash.  All other systems reviewed and are negative.    Allergies  Morphine and related and Promethazine hcl  Home Medications   Current Outpatient Rx  Name  Route  Sig  Dispense  Refill  . albuterol (PROVENTIL HFA;VENTOLIN HFA) 108 (90 BASE) MCG/ACT inhaler   Inhalation   Inhale 1-2 puffs into the lungs every 6 (six) hours as needed for wheezing.   1 Inhaler   1   . aspirin EC 81 MG tablet   Oral   Take 162 mg by mouth daily.           . beclomethasone (QVAR) 80  MCG/ACT inhaler   Inhalation   Inhale 1 puff into the lungs as needed.   1 Inhaler   1   . fish oil-omega-3 fatty acids 1000 MG capsule   Oral   Take 3 g by mouth daily.          . Flaxseed, Linseed, (FLAX SEED OIL PO)   Oral   Take 2 tablets by mouth daily.          Marland Kitchen HYDROcodone-acetaminophen (LORTAB) 7.5-500 MG/15ML solution   Oral   Take 15 mLs by mouth every 6 (six) hours as needed for pain.   120 mL   0   . Hydrocortisone (CORTIZONE-10 EX)   Apply externally   Apply 1 application topically 3 (three) times daily. To rash at nape of neck         . levofloxacin (LEVAQUIN) 500 MG tablet   Oral   Take 1 tablet (500 mg total) by mouth daily.   7 tablet   0    BP 142/72  Pulse 89  Temp(Src) 98.3 F (36.8 C) (Oral)  Resp 20  Ht 5\' 3"  (1.6 m)  Wt 153 lb (69.4 kg)  BMI 27.11 kg/m2  SpO2 97% Physical Exam  Constitutional: She is oriented to person, place,  and time. She appears well-developed and well-nourished. No distress.  HENT:  Head: Normocephalic and atraumatic.    Mouth/Throat: Oropharynx is clear and moist.  Eyes: Conjunctivae are normal. Pupils are equal, round, and reactive to light.  Neck: Normal range of motion. Neck supple.  Cardiovascular: Normal rate, regular rhythm and intact distal pulses.   Pulmonary/Chest: Effort normal and breath sounds normal. She has no wheezes. She has no rales.  Abdominal: Soft. Bowel sounds are normal. There is no tenderness. There is no rebound and no guarding.  Musculoskeletal: Normal range of motion.  Neurological: She is alert and oriented to person, place, and time.  Skin: Skin is warm and dry.     Psychiatric: She has a normal mood and affect.    ED Course  Procedures (including critical care time) Labs Review Labs Reviewed - No data to display Imaging Review No results found.  EKG Interpretation   None       MDM  No diagnosis found. Will treat for skin infection which is mild  with  keflex    Tamica Covell Smitty Cords, MD 09/12/13 0425

## 2013-09-12 NOTE — ED Notes (Signed)
Pt reports rash around right eye and under right arm since 12-23- hx of shingles

## 2013-10-15 ENCOUNTER — Emergency Department (HOSPITAL_BASED_OUTPATIENT_CLINIC_OR_DEPARTMENT_OTHER)
Admission: EM | Admit: 2013-10-15 | Discharge: 2013-10-15 | Disposition: A | Discharge status: Home / Self Care | Payer: Medicare Other | Attending: Emergency Medicine | Admitting: Emergency Medicine

## 2013-10-15 ENCOUNTER — Encounter (HOSPITAL_BASED_OUTPATIENT_CLINIC_OR_DEPARTMENT_OTHER): Payer: Self-pay | Admitting: Emergency Medicine

## 2013-10-15 DIAGNOSIS — J449 Chronic obstructive pulmonary disease, unspecified: Secondary | ICD-10-CM | POA: Insufficient documentation

## 2013-10-15 DIAGNOSIS — J069 Acute upper respiratory infection, unspecified: Secondary | ICD-10-CM

## 2013-10-15 DIAGNOSIS — Z9089 Acquired absence of other organs: Secondary | ICD-10-CM | POA: Insufficient documentation

## 2013-10-15 DIAGNOSIS — IMO0002 Reserved for concepts with insufficient information to code with codable children: Secondary | ICD-10-CM | POA: Insufficient documentation

## 2013-10-15 DIAGNOSIS — Z7982 Long term (current) use of aspirin: Secondary | ICD-10-CM | POA: Insufficient documentation

## 2013-10-15 DIAGNOSIS — M129 Arthropathy, unspecified: Secondary | ICD-10-CM | POA: Insufficient documentation

## 2013-10-15 DIAGNOSIS — Z8619 Personal history of other infectious and parasitic diseases: Secondary | ICD-10-CM | POA: Insufficient documentation

## 2013-10-15 DIAGNOSIS — Z792 Long term (current) use of antibiotics: Secondary | ICD-10-CM | POA: Insufficient documentation

## 2013-10-15 DIAGNOSIS — Z79899 Other long term (current) drug therapy: Secondary | ICD-10-CM | POA: Insufficient documentation

## 2013-10-15 DIAGNOSIS — J4489 Other specified chronic obstructive pulmonary disease: Secondary | ICD-10-CM | POA: Insufficient documentation

## 2013-10-15 LAB — RAPID STREP SCREEN (MED CTR MEBANE ONLY): Streptococcus, Group A Screen (Direct): NEGATIVE

## 2013-10-15 MED ORDER — ALBUTEROL SULFATE HFA 108 (90 BASE) MCG/ACT IN AERS
2.0000 | INHALATION_SPRAY | RESPIRATORY_TRACT | Status: DC | PRN
  Administered 2013-10-15: 2 via RESPIRATORY_TRACT
  Filled 2013-10-15: qty 6.7

## 2013-10-15 MED ORDER — HYDROCODONE-HOMATROPINE 5-1.5 MG/5ML PO SYRP: 5.0000 mL | ORAL_SOLUTION | Freq: Four times a day (QID) | ORAL | Status: DC | PRN

## 2013-10-15 NOTE — Discharge Instructions (Signed)
Upper Respiratory Infection, Adult  ° ° °An upper respiratory infection (URI) is also sometimes known as the common cold. The upper respiratory tract includes the nose, sinuses, throat, trachea, and bronchi. Bronchi are the airways leading to the lungs. Most people improve within 1 week, but symptoms can last up to 2 weeks. A residual cough may last even longer.  °CAUSES  °Many different viruses can infect the tissues lining the upper respiratory tract. The tissues become irritated and inflamed and often become very moist. Mucus production is also common. A cold is contagious. You can easily spread the virus to others by oral contact. This includes kissing, sharing a glass, coughing, or sneezing. Touching your mouth or nose and then touching a surface, which is then touched by another person, can also spread the virus.  °SYMPTOMS  °Symptoms typically develop 1 to 3 days after you come in contact with a cold virus. Symptoms vary from person to person. They may include:  °Runny nose.  °Sneezing.  °Nasal congestion.  °Sinus irritation.  °Sore throat.  °Loss of voice (laryngitis).  °Cough.  °Fatigue.  °Muscle aches.  °Loss of appetite.  °Headache.  °Low-grade fever. °DIAGNOSIS  °You might diagnose your own cold based on familiar symptoms, since most people get a cold 2 to 3 times a year. Your caregiver can confirm this based on your exam. Most importantly, your caregiver can check that your symptoms are not due to another disease such as strep throat, sinusitis, pneumonia, asthma, or epiglottitis. Blood tests, throat tests, and X-rays are not necessary to diagnose a common cold, but they may sometimes be helpful in excluding other more serious diseases. Your caregiver will decide if any further tests are required.  °RISKS AND COMPLICATIONS  °You may be at risk for a more severe case of the common cold if you smoke cigarettes, have chronic heart disease (such as heart failure) or lung disease (such as asthma), or if you  have a weakened immune system. The very young and very old are also at risk for more serious infections. Bacterial sinusitis, middle ear infections, and bacterial pneumonia can complicate the common cold. The common cold can worsen asthma and chronic obstructive pulmonary disease (COPD). Sometimes, these complications can require emergency medical care and may be life-threatening.  °PREVENTION  °The best way to protect against getting a cold is to practice good hygiene. Avoid oral or hand contact with people with cold symptoms. Wash your hands often if contact occurs. There is no clear evidence that vitamin C, vitamin E, echinacea, or exercise reduces the chance of developing a cold. However, it is always recommended to get plenty of rest and practice good nutrition.  °TREATMENT  °Treatment is directed at relieving symptoms. There is no cure. Antibiotics are not effective, because the infection is caused by a virus, not by bacteria. Treatment may include:  °Increased fluid intake. Sports drinks offer valuable electrolytes, sugars, and fluids.  °Breathing heated mist or steam (vaporizer or shower).  °Eating chicken soup or other clear broths, and maintaining good nutrition.  °Getting plenty of rest.  °Using gargles or lozenges for comfort.  °Controlling fevers with ibuprofen or acetaminophen as directed by your caregiver.  °Increasing usage of your inhaler if you have asthma. °Zinc gel and zinc lozenges, taken in the first 24 hours of the common cold, can shorten the duration and lessen the severity of symptoms. Pain medicines may help with fever, muscle aches, and throat pain. A variety of non-prescription medicines are available   to treat congestion and runny nose. Your caregiver can make recommendations and may suggest nasal or lung inhalers for other symptoms.  °HOME CARE INSTRUCTIONS  °Only take over-the-counter or prescription medicines for pain, discomfort, or fever as directed by your caregiver.  °Use a warm  mist humidifier or inhale steam from a shower to increase air moisture. This may keep secretions moist and make it easier to breathe.  °Drink enough water and fluids to keep your urine clear or pale yellow.  °Rest as needed.  °Return to work when your temperature has returned to normal or as your caregiver advises. You may need to stay home longer to avoid infecting others. You can also use a face mask and careful hand washing to prevent spread of the virus. °SEEK MEDICAL CARE IF:  °After the first few days, you feel you are getting worse rather than better.  °You need your caregiver's advice about medicines to control symptoms.  °You develop chills, worsening shortness of breath, or brown or red sputum. These may be signs of pneumonia.  °You develop yellow or brown nasal discharge or pain in the face, especially when you bend forward. These may be signs of sinusitis.  °You develop a fever, swollen neck glands, pain with swallowing, or white areas in the back of your throat. These may be signs of strep throat. °SEEK IMMEDIATE MEDICAL CARE IF:  °You have a fever.  °You develop severe or persistent headache, ear pain, sinus pain, or chest pain.  °You develop wheezing, a prolonged cough, cough up blood, or have a change in your usual mucus (if you have chronic lung disease).  °You develop sore muscles or a stiff neck. °Document Released: 02/25/2001 Document Revised: 11/24/2011 Document Reviewed: 01/03/2011  °ExitCare® Patient Information ©2014 [employer]. ° °Antibiotic Nonuse ° Your caregiver felt that the infection or problem was not one that would be helped with an antibiotic. °Infections may be caused by viruses or bacteria. Only a caregiver can tell which one of these is the likely cause of an illness. A cold is the most common cause of infection in both adults and children. A cold is a virus. Antibiotic treatment will have no effect on a viral infection. Viruses can lead to many lost days of work caring for  sick children and many missed days of school. Children may catch as many as 10 "colds" or "flus" per year during which they can be tearful, cranky, and uncomfortable. The goal of treating a virus is aimed at keeping the ill person comfortable. °Antibiotics are medications used to help the body fight bacterial infections. There are relatively few types of bacteria that cause infections but there are hundreds of viruses. While both viruses and bacteria cause infection they are very different types of germs. A viral infection will typically go away by itself within 7 to 10 days. Bacterial infections may spread or get worse without antibiotic treatment. °Examples of bacterial infections are: °· Sore throats (like strep throat or tonsillitis). °· Infection in the lung (pneumonia). °· Ear and skin infections. °Examples of viral infections are: °· Colds or flus. °· Most coughs and bronchitis. °· Sore throats not caused by Strep. °· Runny noses. °It is often best not to take an antibiotic when a viral infection is the cause of the problem. Antibiotics can kill off the helpful bacteria that we have inside our body and allow harmful bacteria to start growing. Antibiotics can cause side effects such as allergies, nausea, and diarrhea without helping   to improve the symptoms of the viral infection. Additionally, repeated uses of antibiotics can cause bacteria inside of our body to become resistant. That resistance can be passed onto harmful bacterial. The next time you have an infection it may be harder to treat if antibiotics are used when they are not needed. Not treating with antibiotics allows our own immune system to develop and take care of infections more efficiently. Also, antibiotics will work better for us when they are prescribed for bacterial infections. °Treatments for a child that is ill may include: °· Give extra fluids throughout the day to stay hydrated. °· Get plenty of rest. °· Only give your child  over-the-counter or prescription medicines for pain, discomfort, or fever as directed by your caregiver. °· The use of a cool mist humidifier may help stuffy noses. °· Cold medications if suggested by your caregiver. °Your caregiver may decide to start you on an antibiotic if: °· The problem you were seen for today continues for a longer length of time than expected. °· You develop a secondary bacterial infection. °SEEK MEDICAL CARE IF: °· Fever lasts longer than 5 days. °· Symptoms continue to get worse after 5 to 7 days or become severe. °· Difficulty in breathing develops. °· Signs of dehydration develop (poor drinking, rare urinating, dark colored urine). °· Changes in behavior or worsening tiredness (listlessness or lethargy). °Document Released: 11/10/2001 Document Revised: 11/24/2011 Document Reviewed: 05/09/2009 °ExitCare® Patient Information ©2014 [employer]. ° °

## 2013-10-15 NOTE — ED Notes (Signed)
Onset of sore throat, 'scratchy throat' since yesterday.  Non productive cough.

## 2013-10-15 NOTE — ED Provider Notes (Signed)
CSN: [id number]     Arrival date & time 10/15/13  1712 History  This chart was scribed for Charles B. Bernette MayersSheldon, MD by Shari HeritageAisha Amuda, ED Scribe. The patient was seen in room MH06/MH06. Patient's care was started at 5:48 PM.    Chief Complaint  Patient presents with  . Sore Throat    The history is provided by the patient. No language interpreter was used.    HPI Comments: [name] is a 68 y.o. female with a history of COPD and arthritis who presents to the Emergency Department complaining of constant, mild sore throat for the past 1 day. Patient states that yesterday her throat began to feel "scratchy." There is associated intermittent subjective fever. She is also complaining of a non productive cough. Patient has taken ibuprofen at home. She denies any other symptoms at this time. Patient does not smoke.   Past Medical History  Diagnosis Date  . Arthritis   . COPD (chronic obstructive pulmonary disease)   . Shingles    Past Surgical History  Procedure Laterality Date  . Abdominal hysterectomy    . Tonsillectomy    . Appendectomy    . Cervical spine surgery     History reviewed. No pertinent family history. History  Substance Use Topics  . Smoking status: Never Smoker   . Smokeless tobacco: Not on file  . Alcohol Use: No   OB History   Grav Para Term Preterm Abortions TAB SAB Ect Mult Living                 Review of Systems A complete 10 system review of systems was obtained and all systems are negative except as noted in the HPI and PMH.   Allergies  Morphine and related and Promethazine hcl  Home Medications   Current Outpatient Rx  Name  Route  Sig  Dispense  Refill  . albuterol (PROVENTIL HFA;VENTOLIN HFA) 108 (90 BASE) MCG/ACT inhaler   Inhalation   Inhale 1-2 puffs into the lungs every 6 (six) hours as needed for wheezing.   1 Inhaler   1   . aspirin EC 81 MG tablet   Oral   Take 162 mg by mouth daily.           . beclomethasone (QVAR) 80  MCG/ACT inhaler   Inhalation   Inhale 1 puff into the lungs as needed.   1 Inhaler   1   . cephALEXin (KEFLEX) 500 MG capsule   Oral   Take 1 capsule (500 mg total) by mouth 4 (four) times daily.   28 capsule   0   . fish oil-omega-3 fatty acids 1000 MG capsule   Oral   Take 3 g by mouth daily.          . Flaxseed, Linseed, (FLAX SEED OIL PO)   Oral   Take 2 tablets by mouth daily.          Marland Kitchen. HYDROcodone-acetaminophen (LORTAB) 7.5-500 MG/15ML solution   Oral   Take 15 mLs by mouth every 6 (six) hours as needed for pain.   120 mL   0   . Hydrocortisone (CORTIZONE-10 EX)   Apply externally   Apply 1 application topically 3 (three) times daily. To rash at nape of neck         . levofloxacin (LEVAQUIN) 500 MG tablet   Oral   Take 1 tablet (500 mg total) by mouth daily.   7 tablet   0  Triage Vitals: BP 142/62  Pulse 105  Temp(Src) 99.4 F (37.4 C) (Oral)  Resp 16  Ht 5\' 3"  (1.6 m)  Wt 150 lb (68.04 kg)  BMI 26.58 kg/m2  SpO2 97% Physical Exam  Constitutional: She is oriented to person, place, and time. She appears well-developed and well-nourished.  HENT:  Head: Normocephalic and atraumatic.  Mouth/Throat: Oropharynx is clear and moist and mucous membranes are normal. No oropharyngeal exudate, posterior oropharyngeal edema or posterior oropharyngeal erythema.  Neck: Neck supple.  Cardiovascular: Normal rate, regular rhythm, normal heart sounds and intact distal pulses.   Pulmonary/Chest: Effort normal and breath sounds normal.  Abdominal: Bowel sounds are normal. There is no tenderness. There is no rebound and no guarding.  Musculoskeletal: Normal range of motion.  Neurological: She is alert and oriented to person, place, and time. No cranial nerve deficit.  Psychiatric: She has a normal mood and affect. Her behavior is normal.    ED Course  Procedures (including critical care time) DIAGNOSTIC STUDIES: Oxygen Saturation is 97% on room air, adequate  by my interpretation.    COORDINATION OF CARE: 6:01 PM- Patient informed of current plan for treatment and evaluation and agrees with plan at this time.  Results for orders placed during the hospital encounter of 10/15/13  RAPID STREP SCREEN      Result Value Range   Streptococcus, Group A Screen (Direct) NEGATIVE  NEGATIVE    EKG Interpretation   None       MDM   1. Viral URI     Strep neg, viral symptoms, advised PCP followup.   I personally performed the services described in this documentation, which was scribed in my presence. The recorded information has been reviewed and is accurate.      Charles B. Bernette Mayers, MD 10/15/13 2008

## 2013-10-17 LAB — CULTURE, GROUP A STREP

## 2013-11-06 ENCOUNTER — Encounter (HOSPITAL_COMMUNITY): Payer: Self-pay | Admitting: Emergency Medicine

## 2013-11-06 ENCOUNTER — Emergency Department (HOSPITAL_COMMUNITY)
Admission: EM | Admit: 2013-11-06 | Discharge: 2013-11-06 | Disposition: A | Discharge status: Home / Self Care | Payer: Medicare Other | Attending: Emergency Medicine | Admitting: Emergency Medicine

## 2013-11-06 ENCOUNTER — Emergency Department (HOSPITAL_COMMUNITY): Payer: Medicare Other

## 2013-11-06 DIAGNOSIS — J4489 Other specified chronic obstructive pulmonary disease: Secondary | ICD-10-CM | POA: Insufficient documentation

## 2013-11-06 DIAGNOSIS — M25539 Pain in unspecified wrist: Secondary | ICD-10-CM | POA: Insufficient documentation

## 2013-11-06 DIAGNOSIS — Z7982 Long term (current) use of aspirin: Secondary | ICD-10-CM | POA: Insufficient documentation

## 2013-11-06 DIAGNOSIS — Z79899 Other long term (current) drug therapy: Secondary | ICD-10-CM | POA: Insufficient documentation

## 2013-11-06 DIAGNOSIS — J449 Chronic obstructive pulmonary disease, unspecified: Secondary | ICD-10-CM | POA: Insufficient documentation

## 2013-11-06 DIAGNOSIS — Z8619 Personal history of other infectious and parasitic diseases: Secondary | ICD-10-CM | POA: Insufficient documentation

## 2013-11-06 DIAGNOSIS — M542 Cervicalgia: Secondary | ICD-10-CM | POA: Insufficient documentation

## 2013-11-06 DIAGNOSIS — M19049 Primary osteoarthritis, unspecified hand: Secondary | ICD-10-CM | POA: Insufficient documentation

## 2013-11-06 MED ORDER — HYDROCODONE-ACETAMINOPHEN 5-325 MG PO TABS: 1.0000 | ORAL_TABLET | Freq: Four times a day (QID) | ORAL | Status: DC | PRN

## 2013-11-06 MED ORDER — IBUPROFEN 800 MG PO TABS: 800.0000 mg | ORAL_TABLET | Freq: Three times a day (TID) | ORAL | Status: DC | PRN

## 2013-11-06 NOTE — ED Notes (Signed)
Pt states pain in left hand for a couple of weeks.  Pt states pain sometimes goes up the arm.  Pt has a bump noted to left wrist.  Does not recall injury.  Pt denies shortness of breath or chest pain.  Pt states pain is not radiating down from shoulder.   Pt also states she does have arthritis

## 2013-11-06 NOTE — Discharge Instructions (Signed)
Return here as needed.  Followup with your primary care Dr. for recheck.  Use ice and heat on your hand

## 2013-11-06 NOTE — ED Notes (Signed)
Pt complains of left wrist pain without trauma.  Pt states she can recall doing nothing to hurt her left wrist.  Pt states the pain is 10/10 at it's worse, but 7/10 generally.  Pt is able to move and use wrist, but states pain increases when she does.

## 2013-11-06 NOTE — ED Provider Notes (Signed)
Medical screening examination/treatment/procedure(s) were performed by non-physician practitioner and as supervising physician I was immediately available for consultation/collaboration.   Mersedes Alber T Marcela Alatorre, MD 11/06/13 1534 

## 2013-11-06 NOTE — ED Provider Notes (Signed)
CSN: [id number]     Arrival date & time 11/06/13  1053 History   First MD Initiated Contact with Patient 11/06/13 1137     Chief Complaint  Patient presents with  . Hand Pain     (Consider location/radiation/quality/duration/timing/severity/associated sxs/prior Treatment) HPI HPI Comments: JEANELL MANGAN is a 68 y.o. female with PMH of arthritis, COPD, cervical spine surgery, and shingles who presents to the Emergency Department complaining of left hand pain. Patient states that the hand pain has been going on for a couple of weeks and radiates up her arm. She is able to move her fingers and wrist, but it is exacerbated by movement. She also states that intermittently she has left neck pain that radiates down her left arm although she is not currently experiencing that. Denies nausea, vomiting, diarrhea, fever, chest pain, SOB, fall, or injury.    Past Medical History  Diagnosis Date  . Arthritis   . COPD (chronic obstructive pulmonary disease)   . Shingles    Past Surgical History  Procedure Laterality Date  . Abdominal hysterectomy    . Tonsillectomy    . Appendectomy    . Cervical spine surgery     History reviewed. No pertinent family history. History  Substance Use Topics  . Smoking status: Never Smoker   . Smokeless tobacco: Not on file  . Alcohol Use: No   OB History   Grav Para Term Preterm Abortions TAB SAB Ect Mult Living                 Review of Systems All other systems negative except as documented in the HPI. All pertinent positives and negatives as reviewed in the HPI.    Allergies  Morphine and related; Promethazine hcl; and Zoloft  Home Medications   Current Outpatient Rx  Name  Route  Sig  Dispense  Refill  . albuterol (PROVENTIL HFA;VENTOLIN HFA) 108 (90 BASE) MCG/ACT inhaler   Inhalation   Inhale 1-2 puffs into the lungs every 6 (six) hours as needed for wheezing.         Marland Kitchen aspirin EC 81 MG tablet   Oral   Take 81 mg by mouth daily.          . fish oil-omega-3 fatty acids 1000 MG capsule   Oral   Take 3 g by mouth daily.          . Flaxseed, Linseed, (FLAX SEED OIL PO)   Oral   Take 2 capsules by mouth daily.          . hydroxypropyl methylcellulose (ISOPTO TEARS) 2.5 % ophthalmic solution   Both Eyes   Place 1 drop into both eyes 3 (three) times daily as needed for dry eyes.         . Prenatal Vit-Fe Fumarate-FA (PRENATAL MULTIVITAMIN) TABS tablet   Oral   Take 1 tablet by mouth daily at 12 noon.          BP 118/63  Pulse 78  Temp(Src) 98.5 F (36.9 C) (Oral)  Resp 16  SpO2 98% Physical Exam  Nursing note and vitals reviewed. Constitutional: She is oriented to person, place, and time. She appears well-developed and well-nourished. No distress.  HENT:  Head: Normocephalic and atraumatic.  Neck: Normal range of motion. Neck supple.  Cardiovascular: Normal rate.   Pulmonary/Chest: Effort normal and breath sounds normal.  Musculoskeletal:       Left wrist: She exhibits decreased range of motion, tenderness and swelling. She exhibits  no effusion and no laceration.       Left hand: She exhibits decreased range of motion and tenderness. She exhibits no swelling. Normal sensation noted. Normal strength noted.  Neurological: She is alert and oriented to person, place, and time.  Skin: Skin is warm and dry. She is not diaphoretic.    ED Course  Procedures (including critical care time) Labs Review Labs Reviewed - No data to display Imaging Review Dg Wrist Complete Left  11/06/2013   CLINICAL DATA:  Pain and swelling at base of thumb radiating towards elbow  EXAM: LEFT WRIST - COMPLETE 3+ VIEW  COMPARISON:  None  FINDINGS: Diffuse osseous demineralization.  Degenerative [facility] and STT joint.  Remaining joint spaces fairly well preserved.  No acute fracture, dislocation or bone destruction.  Small calcified ossicle adjacent to the trapezius, corticated/old.  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   Electronically Signed   By: Ulyses SouthwardMark  Boles M.D.   On: 11/06/2013 11:53    Patient be referred to her primary care Dr. for further evaluation of also advised her to use ice and heat on her hand.  Patient be placed in a thumb spica Velcro splint for comfort.  Patient's x-rays showed degenerative changes in the hand the patient complains of some intermittent cervical radiculopathy and advised the patient to follow up with her primary care Dr. for this issue as well    Carlyle Dollyhristopher W Tamesha Ellerbrock, PA-C 11/06/13 1230

## 2014-06-24 ENCOUNTER — Emergency Department (HOSPITAL_COMMUNITY): Payer: Medicare Other

## 2014-06-24 ENCOUNTER — Observation Stay (HOSPITAL_COMMUNITY)
Admission: EM | Admit: 2014-06-24 | Discharge: 2014-06-26 | Disposition: A | Discharge status: Home / Self Care | Payer: Medicare Other | Attending: Internal Medicine | Admitting: Internal Medicine

## 2014-06-24 ENCOUNTER — Encounter (HOSPITAL_COMMUNITY): Payer: Self-pay | Admitting: Emergency Medicine

## 2014-06-24 DIAGNOSIS — J449 Chronic obstructive pulmonary disease, unspecified: Secondary | ICD-10-CM | POA: Diagnosis not present

## 2014-06-24 DIAGNOSIS — R519 Headache, unspecified: Secondary | ICD-10-CM

## 2014-06-24 DIAGNOSIS — I639 Cerebral infarction, unspecified: Secondary | ICD-10-CM

## 2014-06-24 DIAGNOSIS — R202 Paresthesia of skin: Secondary | ICD-10-CM | POA: Diagnosis not present

## 2014-06-24 DIAGNOSIS — G4459 Other complicated headache syndrome: Secondary | ICD-10-CM

## 2014-06-24 DIAGNOSIS — R51 Headache: Secondary | ICD-10-CM

## 2014-06-24 DIAGNOSIS — R2990 Unspecified symptoms and signs involving the nervous system: Secondary | ICD-10-CM

## 2014-06-24 LAB — CBC WITH DIFFERENTIAL/PLATELET
BASOS PCT: 0 % (ref 0–1)
Basophils Absolute: 0 10*3/uL (ref 0.0–0.1)
EOS PCT: 1 % (ref 0–5)
Eosinophils Absolute: 0.1 10*3/uL (ref 0.0–0.7)
HEMATOCRIT: 35 % — AB (ref 36.0–46.0)
Hemoglobin: 12.1 g/dL (ref 12.0–15.0)
Lymphocytes Relative: 23 % (ref 12–46)
Lymphs Abs: 1.8 10*3/uL (ref 0.7–4.0)
MCH: 30.4 pg (ref 26.0–34.0)
MCHC: 34.6 g/dL (ref 30.0–36.0)
MCV: 87.9 fL (ref 78.0–100.0)
Monocytes Absolute: 0.8 10*3/uL (ref 0.1–1.0)
Monocytes Relative: 10 % (ref 3–12)
NEUTROS PCT: 66 % (ref 43–77)
Neutro Abs: 5.1 10*3/uL (ref 1.7–7.7)
Platelets: 168 10*3/uL (ref 150–400)
RBC: 3.98 MIL/uL (ref 3.87–5.11)
RDW: 13.3 % (ref 11.5–15.5)
WBC: 7.7 10*3/uL (ref 4.0–10.5)

## 2014-06-24 MED ORDER — ACETAMINOPHEN 325 MG PO TABS
650.0000 mg | ORAL_TABLET | Freq: Once | ORAL | Status: AC
  Administered 2014-06-24: 650 mg via ORAL
  Filled 2014-06-24: qty 2

## 2014-06-24 NOTE — ED Notes (Signed)
Pt c/o L sided weakness while dancing. L arm/leg got weak with shortness of breath. Pt states her chest "feels funny". Denies chest pain. C/o R side neck pain. Pt has no facial droop, speech clear, no dyphasia. Pt states she feels a little better now, but still feels weak. Pt alert, no acute distress. Skin warm and dry.

## 2014-06-24 NOTE — ED Notes (Signed)
Pt is c/o headache at the present time.

## 2014-06-25 ENCOUNTER — Inpatient Hospital Stay (HOSPITAL_COMMUNITY): Payer: Medicare Other

## 2014-06-25 ENCOUNTER — Encounter (HOSPITAL_COMMUNITY): Payer: Self-pay | Admitting: *Deleted

## 2014-06-25 DIAGNOSIS — R2990 Unspecified symptoms and signs involving the nervous system: Secondary | ICD-10-CM

## 2014-06-25 DIAGNOSIS — I639 Cerebral infarction, unspecified: Secondary | ICD-10-CM | POA: Diagnosis present

## 2014-06-25 LAB — BASIC METABOLIC PANEL
Anion gap: 14 (ref 5–15)
BUN: 21 mg/dL (ref 6–23)
CO2: 23 mEq/L (ref 19–32)
CREATININE: 0.62 mg/dL (ref 0.50–1.10)
Calcium: 9.2 mg/dL (ref 8.4–10.5)
Chloride: 102 mEq/L (ref 96–112)
GFR calc non Af Amer: >90 mL/min (ref >90)
Glucose, Bld: 105 mg/dL — ABNORMAL HIGH (ref 70–99)
Potassium: 3.5 mEq/L — ABNORMAL LOW (ref 3.7–5.3)
Sodium: 139 mEq/L (ref 137–147)

## 2014-06-25 LAB — TSH: TSH: 5.86 u[IU]/mL — ABNORMAL HIGH (ref 0.350–4.500)

## 2014-06-25 LAB — URINALYSIS, ROUTINE W REFLEX MICROSCOPIC
BILIRUBIN URINE: NEGATIVE
Glucose, UA: NEGATIVE mg/dL
HGB URINE DIPSTICK: NEGATIVE
Ketones, ur: NEGATIVE mg/dL
Nitrite: NEGATIVE
PROTEIN: NEGATIVE mg/dL
Specific Gravity, Urine: 1.005 (ref 1.005–1.030)
UROBILINOGEN UA: 0.2 mg/dL (ref 0.0–1.0)
pH: 6.5 (ref 5.0–8.0)

## 2014-06-25 LAB — URINE MICROSCOPIC-ADD ON

## 2014-06-25 LAB — I-STAT TROPONIN, ED: Troponin i, poc: 0.03 ng/mL (ref 0.00–0.08)

## 2014-06-25 MED ORDER — ALBUTEROL SULFATE (2.5 MG/3ML) 0.083% IN NEBU: 2.5000 mg | INHALATION_SOLUTION | Freq: Four times a day (QID) | RESPIRATORY_TRACT | Status: DC | PRN

## 2014-06-25 MED ORDER — PROCHLORPERAZINE EDISYLATE 5 MG/ML IJ SOLN: 10.0000 mg | Freq: Once | INTRAMUSCULAR | Status: AC

## 2014-06-25 MED ORDER — ONDANSETRON HCL 4 MG/2ML IJ SOLN
4.0000 mg | Freq: Once | INTRAMUSCULAR | Status: AC
  Administered 2014-06-25: 4 mg via INTRAVENOUS
  Filled 2014-06-25: qty 2

## 2014-06-25 MED ORDER — LORAZEPAM 2 MG/ML IJ SOLN
0.5000 mg | Freq: Once | INTRAMUSCULAR | Status: AC
  Administered 2014-06-25: 0.5 mg via INTRAVENOUS
  Filled 2014-06-25: qty 1

## 2014-06-25 MED ORDER — ASPIRIN EC 81 MG PO TBEC
81.0000 mg | DELAYED_RELEASE_TABLET | Freq: Every day | ORAL | Status: DC
  Filled 2014-06-25: qty 1

## 2014-06-25 MED ORDER — STROKE: EARLY STAGES OF RECOVERY BOOK
Freq: Once | Status: AC
  Administered 2014-06-25: 10:00:00
  Filled 2014-06-25: qty 1

## 2014-06-25 MED ORDER — ENOXAPARIN SODIUM 40 MG/0.4ML ~~LOC~~ SOLN
40.0000 mg | SUBCUTANEOUS | Status: DC
  Administered 2014-06-25 – 2014-06-26 (×2): 40 mg via SUBCUTANEOUS
  Filled 2014-06-25 (×2): qty 0.4

## 2014-06-25 MED ORDER — ASPIRIN EC 325 MG PO TBEC
325.0000 mg | DELAYED_RELEASE_TABLET | Freq: Every day | ORAL | Status: DC
  Administered 2014-06-25 – 2014-06-26 (×2): 325 mg via ORAL
  Filled 2014-06-25 (×2): qty 1

## 2014-06-25 MED ORDER — TRAMADOL HCL 50 MG PO TABS
50.0000 mg | ORAL_TABLET | Freq: Once | ORAL | Status: AC
  Administered 2014-06-25: 50 mg via ORAL
  Filled 2014-06-25: qty 1

## 2014-06-25 MED ORDER — ONDANSETRON HCL 4 MG/2ML IJ SOLN
4.0000 mg | Freq: Four times a day (QID) | INTRAMUSCULAR | Status: DC | PRN
  Administered 2014-06-26: 4 mg via INTRAVENOUS
  Filled 2014-06-25: qty 2

## 2014-06-25 MED ORDER — ACETAMINOPHEN 325 MG PO TABS
650.0000 mg | ORAL_TABLET | Freq: Four times a day (QID) | ORAL | Status: DC | PRN
  Administered 2014-06-25 – 2014-06-26 (×2): 650 mg via ORAL
  Filled 2014-06-25 (×2): qty 2

## 2014-06-25 MED ORDER — PROCHLORPERAZINE EDISYLATE 5 MG/ML IJ SOLN
10.0000 mg | Freq: Four times a day (QID) | INTRAMUSCULAR | Status: DC | PRN
  Administered 2014-06-25: 10 mg via INTRAVENOUS
  Filled 2014-06-25: qty 2

## 2014-06-25 MED ORDER — POTASSIUM CHLORIDE CRYS ER 20 MEQ PO TBCR
40.0000 meq | EXTENDED_RELEASE_TABLET | Freq: Once | ORAL | Status: AC
  Administered 2014-06-25: 40 meq via ORAL
  Filled 2014-06-25: qty 2

## 2014-06-25 MED ORDER — IOHEXOL 350 MG/ML SOLN
100.0000 mL | Freq: Once | INTRAVENOUS | Status: AC | PRN
  Administered 2014-06-25: 100 mL via INTRAVENOUS

## 2014-06-25 MED ORDER — DIPHENHYDRAMINE HCL 50 MG/ML IJ SOLN
25.0000 mg | Freq: Once | INTRAMUSCULAR | Status: AC
  Administered 2014-06-25: 25 mg via INTRAVENOUS
  Filled 2014-06-25: qty 1

## 2014-06-25 NOTE — ED Provider Notes (Signed)
CSN: [id number]     Arrival date & time 06/24/14  2215 History   First MD Initiated Contact with Patient 06/24/14 2303     Chief Complaint  Patient presents with  . Numbness  . Near Syncope     (Consider location/radiation/quality/duration/timing/severity/associated sxs/prior Treatment) HPI  This is a 68 year old female with history of arthritis, COPD who presents with left leg and arm numbness and paresthesias. She also reports presyncope. Patient reports that she was at a senior day and tonight when she noted cramping of her left leg. The sheath and experienced numbness of both her left arm and left leg. She denies any vision changes, difficulty speaking, weakness. She states that she felt like she might pass out. At that time she had onset of head and neck pain. Her pain right now is 8/10. He cannot quickly at approximately 9 PM. She has been ambulatory. No known history of stroke. She takes a baby aspirin daily. She is nonsmoker. Currently patient states that she just feels "funny." She reports persistent neck and head pain. She denies any chest pain or shortness of breath at this time.  Past Medical History  Diagnosis Date  . Arthritis   . COPD (chronic obstructive pulmonary disease)   . Shingles    Past Surgical History  Procedure Laterality Date  . Abdominal hysterectomy    . Tonsillectomy    . Appendectomy    . Cervical spine surgery     No family history on file. History  Substance Use Topics  . Smoking status: Never Smoker   . Smokeless tobacco: Not on file  . Alcohol Use: No   OB History   Grav Para Term Preterm Abortions TAB SAB Ect Mult Living                 Review of Systems  Constitutional: Negative for fever.  Eyes: Negative for visual disturbance.  Respiratory: Negative for chest tightness and shortness of breath.   Cardiovascular: Negative for chest pain.  Gastrointestinal: Negative for nausea, vomiting and abdominal pain.  Genitourinary: Negative for  dysuria.  Musculoskeletal: Positive for neck pain. Negative for back pain.  Neurological: Positive for light-headedness, numbness and headaches. Negative for weakness.  Psychiatric/Behavioral: Negative for confusion.  All other systems reviewed and are negative.     Allergies  Morphine and related; Promethazine hcl; and Zoloft  Home Medications   Prior to Admission medications   Medication Sig Start Date End Date Taking? Authorizing Provider  aspirin EC 81 MG tablet Take 81 mg by mouth daily.    Yes Historical Provider, MD  fish oil-omega-3 fatty acids 1000 MG capsule Take 1 g by mouth daily.    Yes Historical Provider, MD  Flaxseed, Linseed, (FLAX SEED OIL PO) Take 2 capsules by mouth daily.    Yes Historical Provider, MD  Prenatal Vit-Fe Fumarate-FA (PRENATAL MULTIVITAMIN) TABS tablet Take 1 tablet by mouth daily at 12 noon.   Yes Historical Provider, MD  albuterol (PROVENTIL HFA;VENTOLIN HFA) 108 (90 BASE) MCG/ACT inhaler Inhale 1-2 puffs into the lungs every 6 (six) hours as needed for wheezing. 11/01/12   Gerhard Munchobert Lockwood, MD   BP 121/64  Pulse 81  Temp(Src) 98.2 F (36.8 C) (Oral)  Resp 16  SpO2 96% Physical Exam  Nursing note and vitals reviewed. Constitutional: She is oriented to person, place, and time. She appears well-developed and well-nourished. No distress.  HENT:  Head: Normocephalic and atraumatic.  Eyes: EOM are normal. Pupils are equal, round, and reactive  to light.  Neck: Normal range of motion. Neck supple.  Cardiovascular: Normal rate, regular rhythm and normal heart sounds.   No murmur heard. Pulmonary/Chest: Effort normal and breath sounds normal. No respiratory distress. She has no wheezes.  Abdominal: Soft. Bowel sounds are normal. There is no tenderness. There is no rebound.  Musculoskeletal: She exhibits no edema.  5 out of 5 strength in all 4 extremities, no drift noted in the upper extremities, drift noted bilaterally in the lower extremities, no  clonus, no dysmetria to finger-nose-finger, speech is fluent  Neurological: She is alert and oriented to person, place, and time.  Skin: Skin is warm and dry.  Psychiatric: She has a normal mood and affect.    ED Course  Procedures (including critical care time) Labs Review Labs Reviewed  CBC WITH DIFFERENTIAL - Abnormal; Notable for the following:    HCT 35.0 (*)    All other components within normal limits  BASIC METABOLIC PANEL - Abnormal; Notable for the following:    Potassium 3.5 (*)    Glucose, Bld 105 (*)    All other components within normal limits  URINALYSIS, ROUTINE W REFLEX MICROSCOPIC - Abnormal; Notable for the following:    Leukocytes, UA TRACE (*)    All other components within normal limits  URINE MICROSCOPIC-ADD ON  I-STAT TROPOININ, ED    Imaging Review Ct Angio Head W/cm &/or Wo Cm  06/25/2014   CLINICAL DATA:  Acute onset LEFT-sided weakness while dancing. Shortness of breath, symptoms improving.  EXAM: CT ANGIOGRAPHY HEAD AND NECK  TECHNIQUE: Multidetector CT imaging of the head and neck was performed using the standard protocol during bolus administration of intravenous contrast. Multiplanar CT image reconstructions and MIPs were obtained to evaluate the vascular anatomy. Carotid stenosis measurements (when applicable) are obtained utilizing NASCET criteria, using the distal internal carotid diameter as the denominator.  CONTRAST:  100mL OMNIPAQUE IOHEXOL 350 MG/ML SOLN  COMPARISON:  MRA/MRI of the head December 29th 2007  FINDINGS: CT HEAD FINDINGS  The ventricles and sulci are normal for age. No intraparenchymal hemorrhage, mass effect nor midline shift. Patchy supratentorial white matter hypodensities are within normal range for patient's age and though non-specific suggest sequelae of chronic small vessel ischemic disease. Tubular probable mineralization LEFT inferior basal ganglia. No acute large vascular territory infarcts. No abnormal parenchymal nor  leptomeningeal enhancement.  No abnormal extra-axial fluid collections. Basal cisterns are patent.  No skull fracture. The included ocular globes and orbital contents are non-suspicious. The mastoid aircells and included paranasal sinuses are well-aerated. Soft tissue within the LEFT external auditory canal may reflect cerumen. Partially imaged C1-2 osteoarthrosis. Patient is edentulous.  CTA HEAD FINDINGS  Anterior circulation: Overall normal appearance of the cervical internal carotid arteries, petrous, cavernous and supra clinoid internal carotid arteries. 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 Widely patent anterior communicating artery. Normal appearance of the anterior and middle cerebral arteries.  Posterior circulation: LEFT vertebral artery is dominant with normal appearance of the vertebral arteries, vertebrobasilar junction and basilar artery, as well as main branch vessels. Normal appearance of the posterior cerebral arteries.  No large vessel occlusion, hemodynamically significant stenosis, dissection, luminal irregularity, contrast extravasation within the anterior nor posterior circulation.  Review of the MIP images confirms the above findings.  CTA NECK FINDINGS  Normal appearance of the thoracic arch, normal branch pattern. The origins of the innominate, left Common carotid artery and subclavian artery are widely patent.  Bilateral Common  carotid arteries are widely patent, coursing in a straight line fashion. Normal appearance of the carotid bifurcations without hemodynamically significant stenosis by NASCET criteria. Normal appearance of the included internal carotid arteries.  Left vertebral artery is dominant. Normal appearance of the vertebral arteries, which appear widely patent. Extrinsic compression of the LEFT vertebral artery due to uncovertebral hypertrophy and facet arthropathy.  No hemodynamically significant stenosis by NASCET criteria.  No dissection, no pseudoaneurysm. No abnormal luminal irregularity. No contrast extravasation.  Soft tissues are unremarkable. No acute osseous process though bone windows have not been submitted. ACDF. 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  Review of the MIP images confirms the above findings.  IMPRESSION: CT HEAD: No acute intracranial process. If ongoing concern for acute ischemia, MRI of the brain with diffusion-weighted sequences would be more sensitive.  Involutional changes. Mild white matter changes suggest chronic small vessel ischemic disease.  CTA HEAD: No acute vascular injury nor hemodynamically significant stenosis. 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  CTA NECK: No acute vascular injury nor hemodynamically significant stenosis.   Electronically Signed   By: Awilda Metro   On: 06/25/2014 02:35   Ct Angio Neck W/cm &/or Wo/cm  06/25/2014   CLINICAL DATA:  Acute onset LEFT-sided weakness while dancing. Shortness of breath, symptoms improving.  EXAM: CT ANGIOGRAPHY HEAD AND NECK  TECHNIQUE: Multidetector CT imaging of the head and neck was performed using the standard protocol during bolus administration of intravenous contrast. Multiplanar CT image reconstructions and MIPs were obtained to evaluate the vascular anatomy. Carotid stenosis measurements (when applicable) are obtained utilizing NASCET criteria, using the distal internal carotid diameter as the denominator.  CONTRAST:  OMNIPAQUE IOHEXOL 350 MG/ML SOLN  COMPARISON:  MRA/MRI of the head December 29th 2007  FINDINGS: CT HEAD FINDINGS  The ventricles and sulci are normal for age. No intraparenchymal hemorrhage, mass effect nor midline shift. Patchy supratentorial white matter hypodensities are within normal range for patient's age and though non-specific suggest sequelae of chronic small vessel ischemic disease. Tubular probable mineralization LEFT inferior basal ganglia. No acute large  vascular territory infarcts. No abnormal parenchymal nor leptomeningeal enhancement.  No abnormal extra-axial fluid collections. Basal cisterns are patent.  No skull fracture. The included ocular globes and orbital contents are non-suspicious. The mastoid aircells and included paranasal sinuses are well-aerated. Soft tissue within the LEFT external auditory canal may reflect cerumen. Partially imaged C1-2 osteoarthrosis. Patient is edentulous.  CTA HEAD FINDINGS  Anterior circulation: Overall normal appearance of the cervical internal carotid arteries, petrous, cavernous and supra clinoid internal carotid arteries. 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 Widely patent anterior communicating artery. Normal appearance of the anterior and middle cerebral arteries.  Posterior circulation: LEFT vertebral artery is dominant with normal appearance of the vertebral arteries, vertebrobasilar junction and basilar artery, as well as main branch vessels. Normal appearance of the posterior cerebral arteries.  No large vessel occlusion, hemodynamically significant stenosis, dissection, luminal irregularity, contrast extravasation within the anterior nor posterior circulation.  Review of the MIP images confirms the above findings.  CTA NECK FINDINGS  Normal appearance of the thoracic arch, normal branch pattern. The origins of the innominate, left Common carotid artery and subclavian artery are widely patent.  Bilateral Common carotid arteries are widely patent, coursing in a straight line fashion. Normal appearance of the carotid bifurcations without hemodynamically significant stenosis by NASCET criteria. Normal appearance of the included internal carotid arteries.  Left vertebral artery  is dominant. Normal appearance of the vertebral arteries, which appear widely patent. Extrinsic compression of the LEFT vertebral artery due to uncovertebral hypertrophy and facet arthropathy.  No  hemodynamically significant stenosis by NASCET criteria. No dissection, no pseudoaneurysm. No abnormal luminal irregularity. No contrast extravasation.  Soft tissues are unremarkable. No acute osseous process though bone windows have not been submitted. ACDF. 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  Review of the MIP images confirms the above findings.  IMPRESSION: CT HEAD: No acute intracranial process. If ongoing concern for acute ischemia, MRI of the brain with diffusion-weighted sequences would be more sensitive.  Involutional changes. Mild white matter changes suggest chronic small vessel ischemic disease.  CTA HEAD: No acute vascular injury nor hemodynamically significant stenosis. 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  CTA NECK: No acute vascular injury nor hemodynamically significant stenosis.   Electronically Signed   By: Awilda Metro   On: 06/25/2014 02:35     EKG Interpretation   Date/Time:  Saturday June 24 2014 22:18:52 EDT Ventricular Rate:  100 PR Interval:  170 QRS Duration: 99 QT Interval:  344 QTC Calculation: 444 R Axis:   74 Text Interpretation:  Sinus tachycardia Anteroseptal infarct, old Similar  to prior Confirmed by Jeffey Janssen  MD, Tamari Redwine ([id number]) on 06/25/2014 12:47:51  AM      MDM   Final diagnoses:  Headache  Stroke-like symptom    Patient presents with numbness, headache neck pain and presyncopal event.  No objective neurologic deficit.  Given onset of headache and neck pain with left sided numbness would have concern for SAH vs carotid dissection.  CT/CTA head and neck obtained.  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  Patient given tylenol and migraine cocktail for possible complicated migraine. On recheck patient with persistent complaints of left arm and leg paresthesias.  Discussed patient with Dr. Cyril Mourning who recommends admission with MRI to r/o thalamic stroke.  Discussed with  Dr. Izola Price for admission.    Shon Baton, MD 06/25/14 1245

## 2014-06-25 NOTE — ED Notes (Signed)
Dispatch called- [name]

## 2014-06-25 NOTE — Progress Notes (Signed)
PROGRESS NOTE    [name] MWN:[id number] DOB: January 10, 1946 DOA: 06/24/2014 PCP: [name], Beth N, NP  HPI/Brief narrative 68 year old female patient with history of COPD, a lifelong nonsmoker, prior "mild stroke" without residual deficits, on aspirin 81 mg daily PTA, was in her usual state of health until approximately 7 PM on 06/24/14 when she experienced sudden onset of pain in the left mid thigh followed shortly thereafter by "explosive" pain, numbness and weakness of entire left half of the body. She was at the [employer] with her spouse. No history of slurred speech or facial asymmetry. Although symptoms improved soon, she still has "weird sensation" of left side of body with associated mild weakness. [facility] and advised by neurologist to [facility]. CTA head and neck were negative for acute findings.   Assessment/Plan:  1. Left-sided numbness, pain and weakness: Unclear etiology.? TIA. CTA head and neck and MRI head without acute findings. Neurology consulted and recommend completing stroke workup. Aspirin changed to 325 mg daily. PT, OT and ST evaluation. Improve. 2. COPD: Stable 3. Hypokalemia: Replaced.   Code Status: Full Family Communication: Discussed with patient's son and spouse at bedside. Disposition Plan: Home possibly 06/26/14.   Consultants:  Neurology  Procedures:  None  Antibiotics:  None   Subjective: Persisting mild "weird sensation" and weakness left half of body.  Objective: Filed Vitals:   06/25/14 0511 06/25/14 0600 06/25/14 0832 06/25/14 1238  BP: 129/70 127/63 123/51 122/68  Pulse: 78 83 76 70  Temp:   97.6 F (36.4 C) 98.1 F (36.7 C)  TempSrc:   Oral Oral  Resp: 18 13 18 18   Height:   5\' 3"  (1.6 m)   Weight:   71.578 kg (157 lb 12.8 oz)   SpO2: 96% 97% 97% 97%   No intake or output data in the 24 hours ending 06/25/14 1512 Filed Weights   06/25/14 0832    Weight: 71.578 kg (157 lb 12.8 oz)     Exam:  General exam: Pleasant middle-aged female lying comfortably in bed. Respiratory system: Clear. No increased work of breathing. Cardiovascular system: S1 & S2 heard, RRR. No JVD, murmurs, gallops, clicks or pedal edema. Telemetry: Sinus rhythm. Gastrointestinal system: Abdomen is nondistended, soft and nontender. Normal bowel sounds heard. Central nervous system: Alert and oriented. No cranial nerve deficits. Patchy left sided paresthesia. Extremities: Symmetric 5 x 5 power.   Data Reviewed: Basic Metabolic Panel:  Recent Labs Lab 06/24/14 2352  NA 139  K 3.5*  CL 102  CO2 23  GLUCOSE 105*  BUN 21  CREATININE 0.62  CALCIUM 9.2   Liver Function Tests: No results found for this basename: AST, ALT, ALKPHOS, BILITOT, PROT, ALBUMIN,  in the last 168 hours No results found for this basename: LIPASE, AMYLASE,  in the last 168 hours No results found for this basename: AMMONIA,  in the last 168 hours CBC:  Recent Labs Lab 06/24/14 2352  WBC 7.7  NEUTROABS 5.1  HGB 12.1  HCT 35.0*  MCV 87.9  PLT 168   Cardiac Enzymes: No results found for this basename: CKTOTAL, CKMB, CKMBINDEX, TROPONINI,  in the last 168 hours BNP (last 3 results) No results found for this basename: PROBNP,  in the last 8760 hours CBG: No results found for this basename: GLUCAP,  in the last 168 hours  No results found for this or any previous visit (from the past 240 hour(s)).  Studies: Ct Angio Head W/cm &/or Wo Cm  06/25/2014   CLINICAL DATA:  Acute onset LEFT-sided weakness while dancing. Shortness of breath, symptoms improving.  EXAM: CT ANGIOGRAPHY HEAD AND NECK  TECHNIQUE: Multidetector CT imaging of the head and neck was performed using the standard protocol during bolus administration of intravenous contrast. Multiplanar CT image reconstructions and MIPs were obtained to evaluate the vascular anatomy. Carotid stenosis measurements  (when applicable) are obtained utilizing NASCET criteria, using the distal internal carotid diameter as the denominator.  CONTRAST:  OMNIPAQUE IOHEXOL 350 MG/ML SOLN  COMPARISON:  MRA/MRI of the head December 29th 2007  FINDINGS: CT HEAD FINDINGS  The ventricles and sulci are normal for age. No intraparenchymal hemorrhage, mass effect nor midline shift. Patchy supratentorial white matter hypodensities are within normal range for patient's age and though non-specific suggest sequelae of chronic small vessel ischemic disease. Tubular probable mineralization LEFT inferior basal ganglia. No acute large vascular territory infarcts. No abnormal parenchymal nor leptomeningeal enhancement.  No abnormal extra-axial fluid collections. Basal cisterns are patent.  No skull fracture. The included ocular globes and orbital contents are non-suspicious. The mastoid aircells and included paranasal sinuses are well-aerated. Soft tissue within the LEFT external auditory canal may reflect cerumen. Partially imaged C1-2 osteoarthrosis. Patient is edentulous.  CTA HEAD FINDINGS  Anterior circulation: Overall normal appearance of the cervical internal carotid arteries, petrous, cavernous and supra clinoid internal carotid arteries. 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 Widely patent anterior communicating artery. Normal appearance of the anterior and middle cerebral arteries.  Posterior circulation: LEFT vertebral artery is dominant with normal appearance of the vertebral arteries, vertebrobasilar junction and basilar artery, as well as main branch vessels. Normal appearance of the posterior cerebral arteries.  No large vessel occlusion, hemodynamically significant stenosis, dissection, luminal irregularity, contrast extravasation within the anterior nor posterior circulation.  Review of the MIP images confirms the above findings.  CTA NECK FINDINGS  Normal appearance of the  thoracic arch, normal branch pattern. The origins of the innominate, left Common carotid artery and subclavian artery are widely patent.  Bilateral Common carotid arteries are widely patent, coursing in a straight line fashion. Normal appearance of the carotid bifurcations without hemodynamically significant stenosis by NASCET criteria. Normal appearance of the included internal carotid arteries.  Left vertebral artery is dominant. Normal appearance of the vertebral arteries, which appear widely patent. Extrinsic compression of the LEFT vertebral artery due to uncovertebral hypertrophy and facet arthropathy.  No hemodynamically significant stenosis by NASCET criteria. No dissection, no pseudoaneurysm. No abnormal luminal irregularity. No contrast extravasation.  Soft tissues are unremarkable. No acute osseous process though bone windows have not been submitted. ACDF. 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  Review of the MIP images confirms the above findings.  IMPRESSION: CT HEAD: No acute intracranial process. If ongoing concern for acute ischemia, MRI of the brain with diffusion-weighted sequences would be more sensitive.  Involutional changes. Mild white matter changes suggest chronic small vessel ischemic disease.  CTA HEAD: No acute vascular injury nor hemodynamically significant stenosis. 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  CTA NECK: No acute vascular injury nor hemodynamically significant stenosis.   Electronically Signed   By: Awilda Metro   On: 06/25/2014 02:35   Dg Chest 2 View  06/25/2014   CLINICAL DATA:  Shortness of breath, left-sided pain. History of COPD. Stroke.  EXAM: CHEST  2 VIEW  COMPARISON:  11/01/2012  FINDINGS: Stable elevation  of the right hemidiaphragm. Heart and mediastinal contours are within normal limits. No focal opacities or effusions. No acute bony abnormality.  IMPRESSION: No active cardiopulmonary disease.   Electronically Signed    By: Charlett NoseKevin  Dover M.D.   On: 06/25/2014 13:35   Ct Angio Neck W/cm &/or Wo/cm  06/25/2014   CLINICAL DATA:  Acute onset LEFT-sided weakness while dancing. Shortness of breath, symptoms improving.  EXAM: CT ANGIOGRAPHY HEAD AND NECK  TECHNIQUE: Multidetector CT imaging of the head and neck was performed using the standard protocol during bolus administration of intravenous contrast. Multiplanar CT image reconstructions and MIPs were obtained to evaluate the vascular anatomy. Carotid stenosis measurements (when applicable) are obtained utilizing NASCET criteria, using the distal internal carotid diameter as the denominator.  CONTRAST:  100mL OMNIPAQUE IOHEXOL 350 MG/ML SOLN  COMPARISON:  MRA/MRI of the head December 29th 2007  FINDINGS: CT HEAD FINDINGS  The ventricles and sulci are normal for age. No intraparenchymal hemorrhage, mass effect nor midline shift. Patchy supratentorial white matter hypodensities are within normal range for patient's age and though non-specific suggest sequelae of chronic small vessel ischemic disease. Tubular probable mineralization LEFT inferior basal ganglia. No acute large vascular territory infarcts. No abnormal parenchymal nor leptomeningeal enhancement.  No abnormal extra-axial fluid collections. Basal cisterns are patent.  No skull fracture. The included ocular globes and orbital contents are non-suspicious. The mastoid aircells and included paranasal sinuses are well-aerated. Soft tissue within the LEFT external auditory canal may reflect cerumen. Partially imaged C1-2 osteoarthrosis. Patient is edentulous.  CTA HEAD FINDINGS  Anterior circulation: Overall normal appearance of the cervical internal carotid arteries, petrous, cavernous and supra clinoid internal carotid arteries. 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 Widely patent anterior communicating artery. Normal appearance of the anterior and middle cerebral arteries.   Posterior circulation: LEFT vertebral artery is dominant with normal appearance of the vertebral arteries, vertebrobasilar junction and basilar artery, as well as main branch vessels. Normal appearance of the posterior cerebral arteries.  No large vessel occlusion, hemodynamically significant stenosis, dissection, luminal irregularity, contrast extravasation within the anterior nor posterior circulation.  Review of the MIP images confirms the above findings.  CTA NECK FINDINGS  Normal appearance of the thoracic arch, normal branch pattern. The origins of the innominate, left Common carotid artery and subclavian artery are widely patent.  Bilateral Common carotid arteries are widely patent, coursing in a straight line fashion. Normal appearance of the carotid bifurcations without hemodynamically significant stenosis by NASCET criteria. Normal appearance of the included internal carotid arteries.  Left vertebral artery is dominant. Normal appearance of the vertebral arteries, which appear widely patent. Extrinsic compression of the LEFT vertebral artery due to uncovertebral hypertrophy and facet arthropathy.  No hemodynamically significant stenosis by NASCET criteria. No dissection, no pseudoaneurysm. No abnormal luminal irregularity. No contrast extravasation.  Soft tissues are unremarkable. No acute osseous process though bone windows have not been submitted. ACDF. 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  Review of the MIP images confirms the above findings.  IMPRESSION: CT HEAD: No acute intracranial process. If ongoing concern for acute ischemia, MRI of the brain with diffusion-weighted sequences would be more sensitive.  Involutional changes. Mild white matter changes suggest chronic small vessel ischemic disease.  CTA HEAD: No acute vascular injury nor hemodynamically significant stenosis. 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  CTA NECK: No acute vascular injury nor  hemodynamically significant stenosis.   Electronically Signed   By:  Courtnay  Bloomer   On: 06/25/2014 02:35   Mr Brain Wo Contrast  06/25/2014   CLINICAL DATA:  69 year old female with acute left extremity numbness and paresthesias. Generalized headache. Near syncope. Initial encounter.  EXAM: MRI HEAD WITHOUT CONTRAST  MRA HEAD WITHOUT CONTRAST  TECHNIQUE: Multiplanar, multiecho pulse sequences of the brain and surrounding structures were obtained without intravenous contrast. Angiographic images of the head were obtained using MRA technique without contrast.  COMPARISON:  CTA head and neck 0156 hr the same day. Brain MRI and MRA 09/12/2006.  FINDINGS: MRI HEAD FINDINGS  Cerebral volume is not significantly changed since 2007. Major intracranial vascular flow voids are stable. No restricted diffusion to suggest acute infarction. No midline shift, mass effect, evidence of mass lesion, ventriculomegaly, extra-axial collection or acute intracranial hemorrhage. Cervicomedullary junction and pituitary are within normal limits. Negative visualized cervical spine except for partially visible ACDF hardware at C5. Normal bone marrow signal. Wallace Cullens and white matter signal is stable since 2007 and within normal limits for age.  Visible internal auditory structures appear normal. Minor paranasal sinus mucosal thickening. Visualized orbit soft tissues are within normal limits. Visualized scalp soft tissues are within normal limits.  MRA HEAD FINDINGS  Stable antegrade flow in the posterior circulation with dominant distal left vertebral artery. Normal PICA origins. Normal vertebrobasilar junction. No basilar stenosis. Normal SCA and PCA origins. Diminutive posterior communicating arteries. Bilateral PCA branches are within normal limits.  Antegrade flow in both ICA siphons. Tortuous distal cervical left ICA. Unchanged tiny right lateral posterior cavernous ICA irregularity seen on series 5, image 94 (series 6, image 71 in  2007), favor atherosclerotic pseudo lesion. No ICA stenosis. Ophthalmic artery origins are within normal limits. Normal carotid termini, MCA and ACA origins. Diminutive or absent anterior communicating artery. Visualized bilateral ACA and MCA branches are within normal limits.  IMPRESSION: 1. No acute intracranial abnormality. Stable and negative for age non contrast MRI appearance of the brain since 2007. 2. Stable since 2007 and essentially negative for age intracranial MRA.   Electronically Signed   By: Augusto Gamble M.D.   On: 06/25/2014 11:50   Mr Maxine Glenn Head/brain Wo Cm  06/25/2014   CLINICAL DATA:  68 year old female with acute left extremity numbness and paresthesias. Generalized headache. Near syncope. Initial encounter.  EXAM: MRI HEAD WITHOUT CONTRAST  MRA HEAD WITHOUT CONTRAST  TECHNIQUE: Multiplanar, multiecho pulse sequences of the brain and surrounding structures were obtained without intravenous contrast. Angiographic images of the head were obtained using MRA technique without contrast.  COMPARISON:  CTA head and neck 0156 hr the same day. Brain MRI and MRA 09/12/2006.  FINDINGS: MRI HEAD FINDINGS  Cerebral volume is not significantly changed since 2007. Major intracranial vascular flow voids are stable. No restricted diffusion to suggest acute infarction. No midline shift, mass effect, evidence of mass lesion, ventriculomegaly, extra-axial collection or acute intracranial hemorrhage. Cervicomedullary junction and pituitary are within normal limits. Negative visualized cervical spine except for partially visible ACDF hardware at C5. Normal bone marrow signal. Wallace Cullens and white matter signal is stable since 2007 and within normal limits for age.  Visible internal auditory structures appear normal. Minor paranasal sinus mucosal thickening. Visualized orbit soft tissues are within normal limits. Visualized scalp soft tissues are within normal limits.  MRA HEAD FINDINGS  Stable antegrade flow in the posterior  circulation with dominant distal left vertebral artery. Normal PICA origins. Normal vertebrobasilar junction. No basilar stenosis. Normal SCA and PCA origins. Diminutive posterior communicating arteries. Bilateral  PCA branches are within normal limits.  Antegrade flow in both ICA siphons. Tortuous distal cervical left ICA. Unchanged tiny right lateral posterior cavernous ICA irregularity seen on series 5, image 94 (series 6, image 71 in 2007), favor atherosclerotic pseudo lesion. No ICA stenosis. Ophthalmic artery origins are within normal limits. Normal carotid termini, MCA and ACA origins. Diminutive or absent anterior communicating artery. Visualized bilateral ACA and MCA branches are within normal limits.  IMPRESSION: 1. No acute intracranial abnormality. Stable and negative for age non contrast MRI appearance of the brain since 2007. 2. Stable since 2007 and essentially negative for age intracranial MRA.   Electronically Signed   By: Augusto Gamble M.D.   On: 06/25/2014 11:50        Scheduled Meds: . aspirin EC  325 mg Oral Daily  . enoxaparin (LOVENOX) injection  40 mg Subcutaneous Q24H   Continuous Infusions:   Active Problems:   Stroke    Time spent: 25 mins    Shataya Winkles, MD, FACP, FHM. [facility] Pager [phone]  If 7PM-7AM, please contact night-coverage [email or web address] Password [email or web address] 06/25/2014, 3:12 PM    LOS: 1 day

## 2014-06-25 NOTE — Progress Notes (Signed)
  Echocardiogram 2D Echocardiogram has been performed.  Georgian CoWILLIAMS, Evia Goldsmith 06/25/2014, 4:13 PM

## 2014-06-25 NOTE — Consult Note (Addendum)
Referring Physician: Hongalgi    Chief Complaint: Left sided numbness  HPI: [name] is an 68 y.o. female who reports that she was out last evening and had the acute onset of pain in her left thigh.  She attempted to massage it out and move around.  She then had acute onset of pain and numbness radiating from her left foot upward to the left side of her face.  When attempting to walk she felt as if she was dragging her left foot as well.  With no improvement [facility] was [facility].  Although improved from their initial severity her symptoms persist.    Date last known well: Date: 06/24/2014 Time last known well: Time: 19:00 tPA Given: No: Not felt to be a stroke, mild symptoms  Past Medical History  Diagnosis Date  . Arthritis   . COPD (chronic obstructive pulmonary disease)   . Shingles     Past Surgical History  Procedure Laterality Date  . Abdominal hysterectomy    . Tonsillectomy    . Appendectomy    . Cervical spine surgery      Family history.: Father died of lung cancer.  Mother died of old age.  Brother died of agent orange.    Social History:  reports that she has never smoked. She does not have any smokeless tobacco history on file. She reports that she does not drink alcohol or use illicit drugs.  Allergies:  Allergies  Allergen Reactions  . Morphine And Related Itching  . Promethazine Hcl Itching  . Zoloft [Sertraline Hcl] Other (See Comments)    Headache     Medications:  I have reviewed the patient's current medications. Prior to Admission:  Prescriptions prior to admission  Medication Sig Dispense Refill  . aspirin EC 81 MG tablet Take 81 mg by mouth daily.       . fish oil-omega-3 fatty acids 1000 MG capsule Take 1 g by mouth daily.       . Flaxseed, Linseed, (FLAX SEED OIL PO) Take 2 capsules by mouth daily.       . Prenatal Vit-Fe Fumarate-FA (PRENATAL MULTIVITAMIN) TABS tablet Take 1 tablet by mouth daily at 12 noon.       Marland Kitchen. albuterol (PROVENTIL HFA;VENTOLIN HFA) 108 (90 BASE) MCG/ACT inhaler Inhale 1-2 puffs into the lungs every 6 (six) hours as needed for wheezing.       Scheduled: . aspirin EC  325 mg Oral Daily  . enoxaparin (LOVENOX) injection  40 mg Subcutaneous Q24H    ROS: History obtained from the patient  General ROS: negative for - chills, fatigue, fever, night sweats, weight gain or weight loss Psychological ROS: negative for - behavioral disorder, hallucinations, memory difficulties, mood swings or suicidal ideation Ophthalmic ROS: negative for - blurry vision, double vision, eye pain or loss of vision ENT ROS: negative for - epistaxis, nasal discharge, oral lesions, sore throat, tinnitus or vertigo Allergy and Immunology ROS: negative for - hives or itchy/watery eyes Hematological and Lymphatic ROS: negative for - bleeding problems, bruising or swollen lymph nodes Endocrine ROS: negative for - galactorrhea, hair pattern changes, polydipsia/polyuria or temperature intolerance Respiratory ROS: negative for - cough, hemoptysis, shortness of breath or wheezing Cardiovascular ROS: negative for - chest pain, dyspnea on exertion, edema or irregular heartbeat Gastrointestinal ROS: negative for - abdominal pain, diarrhea, hematemesis, nausea/vomiting or stool incontinence Genito-Urinary ROS: negative for - dysuria, hematuria, incontinence or urinary frequency/urgency Musculoskeletal ROS: as noted in HPI  Neurological ROS: as noted in HPI Dermatological ROS: negative for rash and skin lesion changes  Physical Examination: Blood pressure 122/68, pulse 70, temperature 98.1 F (36.7 C), temperature source Oral, resp. rate 18, height 5\' 3"  (1.6 m), weight 71.578 kg (157 lb 12.8 oz), SpO2 97.00%.  Neurologic Examination: Mental Status: Alert, oriented, thought content appropriate.  Speech fluent without evidence of aphasia.  Able to follow 3 step commands without difficulty. Cranial Nerves: II: Discs  flat bilaterally; Visual fields grossly normal, pupils equal, round, reactive to light and accommodation III,IV, VI: ptosis not present, extra-ocular motions intact bilaterally V,VII: smile symmetric, facial light touch sensation normal bilaterally VIII: hearing normal bilaterally IX,X: gag reflex present XI: bilateral shoulder shrug XII: midline tongue extension Motor: Right : Upper extremity   5/5    Left:     Upper extremity   5/5; no drift  Lower extremity   5/5     Lower extremity   5/5 Tone and bulk:normal tone throughout; no atrophy noted Sensory: Pinprick and light touch decreased on the left splitting the midline on the trunk Deep Tendon Reflexes: 2+ and symmetric throughout Plantars: Right: downgoing   Left: downgoing Cerebellar: normal finger-to-nose and normal heel-to-shin testing bilaterally Gait: unable to test CV: pulses palpable throughout     Laboratory Studies:  Basic Metabolic Panel:  Recent Labs Lab 06/24/14 2352  NA 139  K 3.5*  CL 102  CO2 23  GLUCOSE 105*  BUN 21  CREATININE 0.62  CALCIUM 9.2    Liver Function Tests: No results found for this basename: AST, ALT, ALKPHOS, BILITOT, PROT, ALBUMIN,  in the last 168 hours No results found for this basename: LIPASE, AMYLASE,  in the last 168 hours No results found for this basename: AMMONIA,  in the last 168 hours  CBC:  Recent Labs Lab 06/24/14 2352  WBC 7.7  NEUTROABS 5.1  HGB 12.1  HCT 35.0*  MCV 87.9  PLT 168    Cardiac Enzymes: No results found for this basename: CKTOTAL, CKMB, CKMBINDEX, TROPONINI,  in the last 168 hours  BNP: No components found with this basename: POCBNP,   CBG: No results found for this basename: GLUCAP,  in the last 168 hours  Microbiology: Results for orders placed during the hospital encounter of 10/15/13  RAPID STREP SCREEN     Status: None   Collection Time    10/15/13  6:02 PM      Result Value Ref Range Status   Streptococcus, Group A Screen  (Direct) NEGATIVE  NEGATIVE Final   Comment: (NOTE)     A Rapid Antigen test may result negative if the antigen level in the     sample is below the detection level of this test. The FDA has not     cleared this test as a stand-alone test therefore the rapid antigen     negative result has reflexed to a Group A Strep culture.  CULTURE, GROUP A STREP     Status: None   Collection Time    10/15/13  6:02 PM      Result Value Ref Range Status   Specimen Description THROAT   Final   Special Requests NONE   Final   Culture     Final   Value: No Beta Hemolytic Streptococci Isolated     Performed at [facility]   Report Status 10/17/2013 FINAL   Final    Coagulation Studies: No results found for this basename: LABPROT, INR,  in  the last 72 hours  Urinalysis:  Recent Labs Lab 06/24/14 2320  COLORURINE YELLOW  LABSPEC 1.005  PHURINE 6.5  GLUCOSEU NEGATIVE  HGBUR NEGATIVE  BILIRUBINUR NEGATIVE  KETONESUR NEGATIVE  PROTEINUR NEGATIVE  UROBILINOGEN 0.2  NITRITE NEGATIVE  LEUKOCYTESUR TRACE*    Lipid Panel:    Component Value Date/Time   CHOL  Value: 217        ATP III CLASSIFICATION:  <200     mg/dL   Desirable  161-096  mg/dL   Borderline High  >=045    mg/dL   High       * 40/05/8118 0505   TRIG 189* 06/19/2010 0505   HDL 42 06/19/2010 0505   CHOLHDL 5.2 06/19/2010 0505   VLDL 38 06/19/2010 0505   LDLCALC  Value: 137        Total Cholesterol/HDL:CHD Risk Coronary Heart Disease Risk Table                     Men   Women  1/2 Average Risk   3.4   3.3  Average Risk       5.0   4.4  2 X Average Risk   9.6   7.1  3 X Average Risk  23.4   11.0        Use the calculated Patient Ratio above and the CHD Risk Table to determine the patient's CHD Risk.        ATP III CLASSIFICATION (LDL):  <100     mg/dL   Optimal  147-829  mg/dL   Near or Above                    Optimal  130-159  mg/dL   Borderline  562-130  mg/dL   High  >865     mg/dL   Very High* 78/12/6960 0505    HgbA1C:  Lab  Results  Component Value Date   HGBA1C  Value: 5.8 (NOTE)                                                                       According to the ADA Clinical Practice Recommendations for 2011, when HbA1c is used as a screening test:   >=6.5%   Diagnostic of Diabetes Mellitus           (if abnormal result  is confirmed)  5.7-6.4%   Increased risk of developing Diabetes Mellitus  References:Diagnosis and Classification of Diabetes Mellitus,Diabetes Care,2011,34(Suppl 1):S62-S69 and Standards of Medical Care in         Diabetes - 2011,Diabetes XBMW,4132,44  (Suppl 1):S11-S61.* 06/18/2010    Urine Drug Screen:   No results found for this basename: labopia, cocainscrnur, labbenz, amphetmu, thcu, labbarb    Alcohol Level: No results found for this basename: ETH,  in the last 168 hours  Other results: EKG: sinus tachycardia at 100 bpm.  Imaging: Ct Angio Head W/cm &/or Wo Cm  06/25/2014   CLINICAL DATA:  Acute onset LEFT-sided weakness while dancing. Shortness of breath, symptoms improving.  EXAM: CT ANGIOGRAPHY HEAD AND NECK  TECHNIQUE: Multidetector CT imaging of the head and neck was performed using the standard protocol during bolus administration of intravenous contrast. Multiplanar CT image  reconstructions and MIPs were obtained to evaluate the vascular anatomy. Carotid stenosis measurements (when applicable) are obtained utilizing NASCET criteria, using the distal internal carotid diameter as the denominator.  CONTRAST:  OMNIPAQUE IOHEXOL 350 MG/ML SOLN  COMPARISON:  MRA/MRI of the head December 29th 2007  FINDINGS: CT HEAD FINDINGS  The ventricles and sulci are normal for age. No intraparenchymal hemorrhage, mass effect nor midline shift. Patchy supratentorial white matter hypodensities are within normal range for patient's age and though non-specific suggest sequelae of chronic small vessel ischemic disease. Tubular probable mineralization LEFT inferior basal ganglia. No acute large vascular  territory infarcts. No abnormal parenchymal nor leptomeningeal enhancement.  No abnormal extra-axial fluid collections. Basal cisterns are patent.  No skull fracture. The included ocular globes and orbital contents are non-suspicious. The mastoid aircells and included paranasal sinuses are well-aerated. Soft tissue within the LEFT external auditory canal may reflect cerumen. Partially imaged C1-2 osteoarthrosis. Patient is edentulous.  CTA HEAD FINDINGS  Anterior circulation: Overall normal appearance of the cervical internal carotid arteries, petrous, cavernous and supra clinoid internal carotid arteries. 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 Widely patent anterior communicating artery. Normal appearance of the anterior and middle cerebral arteries.  Posterior circulation: LEFT vertebral artery is dominant with normal appearance of the vertebral arteries, vertebrobasilar junction and basilar artery, as well as main branch vessels. Normal appearance of the posterior cerebral arteries.  No large vessel occlusion, hemodynamically significant stenosis, dissection, luminal irregularity, contrast extravasation within the anterior nor posterior circulation.  Review of the MIP images confirms the above findings.  CTA NECK FINDINGS  Normal appearance of the thoracic arch, normal branch pattern. The origins of the innominate, left Common carotid artery and subclavian artery are widely patent.  Bilateral Common carotid arteries are widely patent, coursing in a straight line fashion. Normal appearance of the carotid bifurcations without hemodynamically significant stenosis by NASCET criteria. Normal appearance of the included internal carotid arteries.  Left vertebral artery is dominant. Normal appearance of the vertebral arteries, which appear widely patent. Extrinsic compression of the LEFT vertebral artery due to uncovertebral hypertrophy and facet arthropathy.  No  hemodynamically significant stenosis by NASCET criteria. No dissection, no pseudoaneurysm. No abnormal luminal irregularity. No contrast extravasation.  Soft tissues are unremarkable. No acute osseous process though bone windows have not been submitted. ACDF. 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  Review of the MIP images confirms the above findings.  IMPRESSION: CT HEAD: No acute intracranial process. If ongoing concern for acute ischemia, MRI of the brain with diffusion-weighted sequences would be more sensitive.  Involutional changes. Mild white matter changes suggest chronic small vessel ischemic disease.  CTA HEAD: No acute vascular injury nor hemodynamically significant stenosis. 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  CTA NECK: No acute vascular injury nor hemodynamically significant stenosis.   Electronically Signed   By: Awilda Metro   On: 06/25/2014 02:35   Dg Chest 2 View  06/25/2014   CLINICAL DATA:  Shortness of breath, left-sided pain. History of COPD. Stroke.  EXAM: CHEST  2 VIEW  COMPARISON:  11/01/2012  FINDINGS: Stable elevation of the right hemidiaphragm. Heart and mediastinal contours are within normal limits. No focal opacities or effusions. No acute bony abnormality.  IMPRESSION: No active cardiopulmonary disease.   Electronically Signed   By: Charlett Nose M.D.   On: 06/25/2014 13:35   Ct Angio Neck W/cm &/or Wo/cm  06/25/2014   CLINICAL DATA:  Acute onset LEFT-sided weakness while dancing. Shortness of breath, symptoms improving.  EXAM: CT ANGIOGRAPHY HEAD AND NECK  TECHNIQUE: Multidetector CT imaging of the head and neck was performed using the standard protocol during bolus administration of intravenous contrast. Multiplanar CT image reconstructions and MIPs were obtained to evaluate the vascular anatomy. Carotid stenosis measurements (when applicable) are obtained utilizing NASCET criteria, using the distal internal carotid diameter as the  denominator.  CONTRAST:  OMNIPAQUE IOHEXOL 350 MG/ML SOLN  COMPARISON:  MRA/MRI of the head December 29th 2007  FINDINGS: CT HEAD FINDINGS  The ventricles and sulci are normal for age. No intraparenchymal hemorrhage, mass effect nor midline shift. Patchy supratentorial white matter hypodensities are within normal range for patient's age and though non-specific suggest sequelae of chronic small vessel ischemic disease. Tubular probable mineralization LEFT inferior basal ganglia. No acute large vascular territory infarcts. No abnormal parenchymal nor leptomeningeal enhancement.  No abnormal extra-axial fluid collections. Basal cisterns are patent.  No skull fracture. The included ocular globes and orbital contents are non-suspicious. The mastoid aircells and included paranasal sinuses are well-aerated. Soft tissue within the LEFT external auditory canal may reflect cerumen. Partially imaged C1-2 osteoarthrosis. Patient is edentulous.  CTA HEAD FINDINGS  Anterior circulation: Overall normal appearance of the cervical internal carotid arteries, petrous, cavernous and supra clinoid internal carotid arteries. 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 Widely patent anterior communicating artery. Normal appearance of the anterior and middle cerebral arteries.  Posterior circulation: LEFT vertebral artery is dominant with normal appearance of the vertebral arteries, vertebrobasilar junction and basilar artery, as well as main branch vessels. Normal appearance of the posterior cerebral arteries.  No large vessel occlusion, hemodynamically significant stenosis, dissection, luminal irregularity, contrast extravasation within the anterior nor posterior circulation.  Review of the MIP images confirms the above findings.  CTA NECK FINDINGS  Normal appearance of the thoracic arch, normal branch pattern. The origins of the innominate, left Common carotid artery and subclavian  artery are widely patent.  Bilateral Common carotid arteries are widely patent, coursing in a straight line fashion. Normal appearance of the carotid bifurcations without hemodynamically significant stenosis by NASCET criteria. Normal appearance of the included internal carotid arteries.  Left vertebral artery is dominant. Normal appearance of the vertebral arteries, which appear widely patent. Extrinsic compression of the LEFT vertebral artery due to uncovertebral hypertrophy and facet arthropathy.  No hemodynamically significant stenosis by NASCET criteria. No dissection, no pseudoaneurysm. No abnormal luminal irregularity. No contrast extravasation.  Soft tissues are unremarkable. No acute osseous process though bone windows have not been submitted. ACDF. 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  Review of the MIP images confirms the above findings.  IMPRESSION: CT HEAD: No acute intracranial process. If ongoing concern for acute ischemia, MRI of the brain with diffusion-weighted sequences would be more sensitive.  Involutional changes. Mild white matter changes suggest chronic small vessel ischemic disease.  CTA HEAD: No acute vascular injury nor hemodynamically significant stenosis. 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  CTA NECK: No acute vascular injury nor hemodynamically significant stenosis.   Electronically Signed   By: Awilda Metro   On: 06/25/2014 02:35   Mr Brain Wo Contrast  06/25/2014   CLINICAL DATA:  68 year old female with acute left extremity numbness and paresthesias. Generalized headache. Near syncope. Initial encounter.  EXAM: MRI HEAD WITHOUT CONTRAST  MRA HEAD WITHOUT CONTRAST  TECHNIQUE: Multiplanar, multiecho pulse sequences of the brain and surrounding structures  were obtained without intravenous contrast. Angiographic images of the head were obtained using MRA technique without contrast.  COMPARISON:  CTA head and neck 0156 hr the same day.  Brain MRI and MRA 09/12/2006.  FINDINGS: MRI HEAD FINDINGS  Cerebral volume is not significantly changed since 2007. Major intracranial vascular flow voids are stable. No restricted diffusion to suggest acute infarction. No midline shift, mass effect, evidence of mass lesion, ventriculomegaly, extra-axial collection or acute intracranial hemorrhage. Cervicomedullary junction and pituitary are within normal limits. Negative visualized cervical spine except for partially visible ACDF hardware at C5. Normal bone marrow signal. Wallace CullensGray and white matter signal is stable since 2007 and within normal limits for age.  Visible internal auditory structures appear normal. Minor paranasal sinus mucosal thickening. Visualized orbit soft tissues are within normal limits. Visualized scalp soft tissues are within normal limits.  MRA HEAD FINDINGS  Stable antegrade flow in the posterior circulation with dominant distal left vertebral artery. Normal PICA origins. Normal vertebrobasilar junction. No basilar stenosis. Normal SCA and PCA origins. Diminutive posterior communicating arteries. Bilateral PCA branches are within normal limits.  Antegrade flow in both ICA siphons. Tortuous distal cervical left ICA. Unchanged tiny right lateral posterior cavernous ICA irregularity seen on series 5, image 94 (series 6, image 71 in 2007), favor atherosclerotic pseudo lesion. No ICA stenosis. Ophthalmic artery origins are within normal limits. Normal carotid termini, MCA and ACA origins. Diminutive or absent anterior communicating artery. Visualized bilateral ACA and MCA branches are within normal limits.  IMPRESSION: 1. No acute intracranial abnormality. Stable and negative for age non contrast MRI appearance of the brain since 2007. 2. Stable since 2007 and essentially negative for age intracranial MRA.   Electronically Signed   By: Augusto GambleLee  Hall M.D.   On: 06/25/2014 11:50   Mr Maxine GlennMra Head/brain Wo Cm  06/25/2014   CLINICAL DATA:  68 year old female  with acute left extremity numbness and paresthesias. Generalized headache. Near syncope. Initial encounter.  EXAM: MRI HEAD WITHOUT CONTRAST  MRA HEAD WITHOUT CONTRAST  TECHNIQUE: Multiplanar, multiecho pulse sequences of the brain and surrounding structures were obtained without intravenous contrast. Angiographic images of the head were obtained using MRA technique without contrast.  COMPARISON:  CTA head and neck 0156 hr the same day. Brain MRI and MRA 09/12/2006.  FINDINGS: MRI HEAD FINDINGS  Cerebral volume is not significantly changed since 2007. Major intracranial vascular flow voids are stable. No restricted diffusion to suggest acute infarction. No midline shift, mass effect, evidence of mass lesion, ventriculomegaly, extra-axial collection or acute intracranial hemorrhage. Cervicomedullary junction and pituitary are within normal limits. Negative visualized cervical spine except for partially visible ACDF hardware at C5. Normal bone marrow signal. Wallace CullensGray and white matter signal is stable since 2007 and within normal limits for age.  Visible internal auditory structures appear normal. Minor paranasal sinus mucosal thickening. Visualized orbit soft tissues are within normal limits. Visualized scalp soft tissues are within normal limits.  MRA HEAD FINDINGS  Stable antegrade flow in the posterior circulation with dominant distal left vertebral artery. Normal PICA origins. Normal vertebrobasilar junction. No basilar stenosis. Normal SCA and PCA origins. Diminutive posterior communicating arteries. Bilateral PCA branches are within normal limits.  Antegrade flow in both ICA siphons. Tortuous distal cervical left ICA. Unchanged tiny right lateral posterior cavernous ICA irregularity seen on series 5, image 94 (series 6, image 71 in 2007), favor atherosclerotic pseudo lesion. No ICA stenosis. Ophthalmic artery origins are within normal limits. Normal carotid termini, MCA and ACA origins.  Diminutive or absent anterior  communicating artery. Visualized bilateral ACA and MCA branches are within normal limits.  IMPRESSION: 1. No acute intracranial abnormality. Stable and negative for age non contrast MRI appearance of the brain since 2007. 2. Stable since 2007 and essentially negative for age intracranial MRA.   Electronically Signed   By: Augusto Gamble M.D.   On: 06/25/2014 11:50    Assessment: 68 y.o. female presenting with left sided pain and numbness.  Although less severe symptoms persist.  MRI of the brain performed.  Review shows no acute changes.  CTA of the head and neck show no hemodynamically significant stenosis.  Unclear etiology of symptoms.  Further work up recommended.    Stroke Risk Factors - none  Plan: 1. HgbA1c, fasting lipid panel, TSH 2. PT consult, OT consult, Speech consult 3. Echocardiogram 4. Prophylactic therapy-Antiplatelet med: Aspirin - dose 325mg  daily 5. Telemetry monitoring 6. Frequent neuro checks    Thana Farr, MD [facility] [phone] 06/25/2014, 2:30 PM

## 2014-06-25 NOTE — H&P (Signed)
[facility] History and Physical  [name] ZOX:[id number] DOB: 03/10/1946 DOA: 06/24/2014  Referring physician: ED physician PCP: Lizbeth BarkSmothers, [name], NP   Chief Complaint: left arm and leg numbness   HPI:  Patient is 68 year old female with history of arthritis, COPD, [facility] with several hours duration of left upper and lower extremity numbness, paresthesia. This has been associated with left leg cramping and patient also reports near syncope, and generalized headache. She felt that she will pass out them every day. She denies similar events in the past. She describes headache as constant and throbbing, 8/10 in severity, radiating to the neck, no specific alleviating or aggravating factors. She denies any history of strokes. She takes baby aspirin daily. She denies chest pain or shortness of breath, no specific abdominal or urinary concerns, no visual changes.  In emergency department patient noted to be hemodynamically stable, vital signs stable. Blood work notable for potassium 3.5. CT head with no acute intracranial process. Nurse practitioner in ED spoke with neurologist on call Dr. Leroy Kennedyamilo and recommendation was to transfer patient to call for further stroke workup.  Assessment and Plan: Active Problems: Left upper and lower extremity weakness - Certainly worrisome for CVA - Admit to call and neuro telemetry unit further evaluation and stroke workup - Check lipid panel, A1c, 2-D echo, carotid Doppler - MRI brain requested for further evaluation - PT/OT/SLP evaluation - Continue baby aspirin - Followup on neurology recommendations Hypokalemia - Mild, supplement and repeat BMP in the morning COPD - Clinically compensated, patient maintaining oxygen saturations at target range - Continue albuterol inhaler per home medical regimen  Radiological Exams on Admission: Ct Angio Neck W/cm &/or Wo/cm  06/25/2014   CT HEAD: No acute  intracranial process. If ongoing concern for acute ischemia, MRI of the brain with diffusion-weighted sequences would be more sensitive.  Involutional changes. Mild white matter changes suggest chronic small vessel ischemic disease.  CTA HEAD: No acute vascular injury nor hemodynamically significant stenosis. 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  CTA NECK: No acute vascular injury nor hemodynamically significant stenosis.     Code Status: Full Family Communication: Pt at bedside Disposition Plan: Admit for further evaluation     Review of Systems:  Constitutional: Negative for fever, chills and malaise/fatigue. Negative for diaphoresis.  HENT: Negative for hearing loss, ear pain, nosebleeds, congestion, tinnitus and ear discharge.   Eyes: Negative for blurred vision, double vision, photophobia, pain, discharge and redness.  Respiratory: Negative for cough, hemoptysis, sputum production, shortness of breath, wheezing and stridor.   Cardiovascular: Negative for chest pain, palpitations, orthopnea, claudication and leg swelling.  Gastrointestinal: Negative for nausea, vomiting and abdominal pain. Negative for heartburn, constipation, blood in stool and melena.  Genitourinary: Negative for dysuria, urgency, frequency, hematuria and flank pain.  Musculoskeletal: Negative for myalgias, back pain, joint pain.  Skin: Negative for itching and rash.  Neurological: Per history of present illness Endo/Heme/Allergies: Negative for environmental allergies and polydipsia. Does not bruise/bleed easily.  Psychiatric/Behavioral: Negative for suicidal ideas. The patient is not nervous/anxious.      Past Medical History  Diagnosis Date  . Arthritis   . COPD (chronic obstructive pulmonary disease)   . Shingles     Past Surgical History  Procedure Laterality Date  . Abdominal hysterectomy    . Tonsillectomy    . Appendectomy    . Cervical spine surgery       Social History:  reports  that she has never smoked. She does not have any smokeless tobacco history on file. She reports that she does not drink alcohol or use illicit drugs.  Allergies  Allergen Reactions  . Morphine And Related Itching  . Promethazine Hcl Itching  . Zoloft [Sertraline Hcl] Other (See Comments)    Headache     No pertinent family medical history  Prior to Admission medications   Medication Sig Start Date End Date Taking? Authorizing Provider  aspirin EC 81 MG tablet Take 81 mg by mouth daily.    Yes Historical Provider, MD  fish oil-omega-3 fatty acids 1000 MG capsule Take 1 g by mouth daily.    Yes Historical Provider, MD  Flaxseed, Linseed, (FLAX SEED OIL PO) Take 2 capsules by mouth daily.    Yes Historical Provider, MD  Prenatal Vit-Fe Fumarate-FA (PRENATAL MULTIVITAMIN) TABS tablet Take 1 tablet by mouth daily at 12 noon.   Yes Historical Provider, MD  albuterol (PROVENTIL HFA;VENTOLIN HFA) 108 (90 BASE) MCG/ACT inhaler Inhale 1-2 puffs into the lungs every 6 (six) hours as needed for wheezing. 11/01/12   Gerhard Munchobert Lockwood, MD    Physical Exam: Filed Vitals:   06/25/14 0025 06/25/14 0230 06/25/14 0233 06/25/14 0300  BP: 139/76 119/61 121/64 115/57  Pulse: 100 81 81 76  Temp:      TempSrc:      Resp: 16 16 16 12   SpO2: 97% 96% 96% 97%    Physical Exam  Constitutional: Appears well-developed and well-nourished. No distress.  HENT: Normocephalic. External right and left ear normal. Oropharynx is clear and moist.  Eyes: Conjunctivae and EOM are normal. PERRLA, no scleral icterus.  Neck: Normal ROM. Neck supple. No JVD. No tracheal deviation. No thyromegaly.  CVS: RRR, S1/S2 +, no murmurs, no gallops, no carotid bruit.  Pulmonary: Effort and breath sounds normal, no stridor, rhonchi, wheezes, rales.  Abdominal: Soft. BS +,  no distension, tenderness, rebound or guarding.  Musculoskeletal: Normal range of motion. No edema and no tenderness.   Lymphadenopathy: No lymphadenopathy noted, cervical, inguinal. Neuro: Alert. No cranial nerve deficit Skin: Skin is warm and dry. No rash noted. Not diaphoretic. No erythema. No pallor.  Psychiatric: Normal mood and affect. Behavior, judgment, thought content normal.   Labs on Admission:  Basic Metabolic Panel:  Recent Labs Lab 06/24/14 2352  NA 139  K 3.5*  CL 102  CO2 23  GLUCOSE 105*  BUN 21  CREATININE 0.62  CALCIUM 9.2   CBC:  Recent Labs Lab 06/24/14 2352  WBC 7.7  NEUTROABS 5.1  HGB 12.1  HCT 35.0*  MCV 87.9  PLT 168    EKG: Normal sinus rhythm, no ST/T wave changes  Debbora PrestoMAGICK-Prajwal Fellner, MD  [facility] Pager [phone]  If 7PM-7AM, please contact night-coverage [email or web address] Password [email or web address] 06/25/2014, 5:00 AM

## 2014-06-26 DIAGNOSIS — G4459 Other complicated headache syndrome: Secondary | ICD-10-CM

## 2014-06-26 LAB — HEMOGLOBIN A1C
HEMOGLOBIN A1C: 5.8 % — AB (ref <5.7)
Mean Plasma Glucose: 120 mg/dL — ABNORMAL HIGH (ref <117)

## 2014-06-26 LAB — BASIC METABOLIC PANEL
Anion gap: 12 (ref 5–15)
BUN: 11 mg/dL (ref 6–23)
CO2: 24 mEq/L (ref 19–32)
Calcium: 8.7 mg/dL (ref 8.4–10.5)
Chloride: 107 mEq/L (ref 96–112)
Creatinine, Ser: 0.69 mg/dL (ref 0.50–1.10)
GFR, EST NON AFRICAN AMERICAN: 88 mL/min — AB (ref >90)
Glucose, Bld: 95 mg/dL (ref 70–99)
POTASSIUM: 3.9 meq/L (ref 3.7–5.3)
Sodium: 143 mEq/L (ref 137–147)

## 2014-06-26 LAB — LIPID PANEL
Cholesterol: 193 mg/dL (ref 0–200)
HDL: 35 mg/dL — ABNORMAL LOW (ref >39)
LDL Cholesterol: 125 mg/dL — ABNORMAL HIGH (ref 0–99)
TRIGLYCERIDES: 167 mg/dL — AB (ref <150)
Total CHOL/HDL Ratio: 5.5 RATIO
VLDL: 33 mg/dL (ref 0–40)

## 2014-06-26 MED ORDER — IBUPROFEN 200 MG PO TABS: 600.0000 mg | ORAL_TABLET | Freq: Three times a day (TID) | ORAL | Status: DC | PRN

## 2014-06-26 MED ORDER — PROCHLORPERAZINE EDISYLATE 5 MG/ML IJ SOLN
10.0000 mg | Freq: Once | INTRAMUSCULAR | Status: AC
  Administered 2014-06-26: 10 mg via INTRAVENOUS
  Filled 2014-06-26: qty 2

## 2014-06-26 MED ORDER — KETOROLAC TROMETHAMINE 30 MG/ML IJ SOLN
30.0000 mg | Freq: Once | INTRAMUSCULAR | Status: AC
  Administered 2014-06-26: 30 mg via INTRAVENOUS
  Filled 2014-06-26: qty 1

## 2014-06-26 NOTE — Progress Notes (Signed)
SLP Cancellation Note  Patient Details Name: [name] MRN: [id number] DOB: [birth year]   Cancelled treatment:       Reason Eval/Treat Not Completed: SLP screened chart, no needs identified, will sign off   Blenda MountsCouture, Alise Calais Laurice 06/26/2014, 12:14 PM

## 2014-06-26 NOTE — Evaluation (Signed)
Physical Therapy Evaluation Patient Details Name: [name] MRN: [id number] DOB: [birth year] Today's Date: 06/26/2014   History of Present Illness  Admitted with L upper and lower extremity numbness and weakness with associated HA.  No acute event per MRI.  Pt mostly resolved by time of eval.  Clinical Impression  Pt at independent to mod I level of mobility even with lingering mild Left sided weakness.  No further PT needs, OT screened out.    Follow Up Recommendations No PT follow up    Equipment Recommendations  None recommended by PT    Recommendations for Other Services       Precautions / Restrictions Precautions Precautions: None      Mobility  Bed Mobility Overal bed mobility: Independent                Transfers Overall transfer level: Independent                  Ambulation/Gait Ambulation/Gait assistance: Independent Ambulation Distance (Feet): 300 Feet Assistive device: None Gait Pattern/deviations: WFL(Within Functional Limits) Gait velocity: age appropriate gait speed   General Gait Details: smooth and fluid.  Stairs Stairs: Yes Stairs assistance: Independent Stair Management: No rails;Alternating pattern;Forwards Number of Stairs: 5 General stair comments: smooth and fluid  Wheelchair Mobility    Modified Rankin (Stroke Patients Only)       Balance Overall balance assessment: Needs assistance Sitting-balance support: No upper extremity supported Sitting balance-Leahy Scale: Normal     Standing balance support: No upper extremity supported Standing balance-Leahy Scale: Normal                   Standardized Balance Assessment Standardized Balance Assessment : Berg Balance Test;Dynamic Gait Index Berg Balance Test Sit to Stand: Able to stand without using hands and stabilize independently Standing Unsupported: Able to stand safely 2 minutes Sitting with Back Unsupported but Feet Supported on Floor or Stool:  Able to sit 2 minutes under supervision Stand to Sit: Sits safely with minimal use of hands Transfers: Able to transfer safely, minor use of hands Standing Unsupported with Eyes Closed: Able to stand 10 seconds safely Standing Ubsupported with Feet Together: Able to place feet together independently and stand 1 minute safely From Standing, Reach Forward with Outstretched Arm: Can reach confidently >25 cm (10") From Standing Position, Pick up Object from Floor: Able to pick up shoe safely and easily From Standing Position, Turn to Look Behind Over each Shoulder: Looks behind from both sides and weight shifts well Turn 360 Degrees: Able to turn 360 degrees safely in 4 seconds or less Standing Unsupported, One Foot in Front: Able to plae foot ahead of the other independently and hold 30 seconds Standing on One Leg: Able to lift leg independently and hold equal to or more than 3 seconds Dynamic Gait Index Level Surface: Normal Change in Gait Speed: Normal Gait with Horizontal Head Turns: Normal Gait with Vertical Head Turns: Normal Gait and Pivot Turn: Normal Step Over Obstacle: Normal Step Around Obstacles: Normal Steps: Normal Total Score: 24       Pertinent Vitals/Pain Pain Assessment: No/denies pain    Home Living Family/patient expects to be discharged to:: Private residence Living Arrangements: Spouse/significant other;Children Available Help at Discharge: Family;Available 24 hours/day Type of Home: House Home Access: Stairs to enter Entrance Stairs-Rails: Doctor, general practiceight;Left Entrance Stairs-Number of Steps: 3 Home Layout: One level Home Equipment: None      Prior Function Level of Independence: Independent  Hand Dominance        Extremity/Trunk Assessment   Upper Extremity Assessment: Overall WFL for tasks assessed           Lower Extremity Assessment: Overall WFL for tasks assessed      Cervical / Trunk Assessment: Normal  Communication    Communication: No difficulties  Cognition Arousal/Alertness: Awake/alert Behavior During Therapy: WFL for tasks assessed/performed Overall Cognitive Status: Within Functional Limits for tasks assessed                      General Comments      Exercises        Assessment/Plan    PT Assessment Patent does not need any further PT services  PT Diagnosis     PT Problem List    PT Treatment Interventions     PT Goals (Current goals can be found in the Care Plan section) Acute Rehab PT Goals PT Goal Formulation: No goals set, d/c therapy    Frequency     Barriers to discharge        Co-evaluation               End of Session   Activity Tolerance: Patient tolerated treatment well Patient left: in bed;with family/visitor present Nurse Communication: Mobility status    Functional Assessment Tool Used: clinical judgement Functional Limitation: Mobility: Walking and moving around Mobility: Walking and Moving Around Current Status ([id number]([id number]): 0 percent impaired, limited or restricted Mobility: Walking and Moving Around Goal Status ([id number](G8979): 0 percent impaired, limited or restricted Mobility: Walking and Moving Around Discharge Status [phone](G8980): 0 percent impaired, limited or restricted    Time: [id number] PT Time Calculation (min): 23 min   Charges:   PT Evaluation $Initial PT Evaluation Tier I: 1 Procedure PT Treatments $Gait Training: 8-22 mins   PT G Codes:   Functional Assessment Tool Used: clinical judgement Functional Limitation: Mobility: Walking and moving around    Pakou Rainbow, Eliseo GumKenneth V 06/26/2014, 5:41 PM  06/26/2014  Pendleton BingKen Kaula Klenke, PT [phone]  (pager)

## 2014-06-26 NOTE — Discharge Summary (Signed)
Physician Discharge Summary  [name]:[id number] DOB: [birth year] DOA: 06/24/2014  PCP: [name], NP  Admit date: 06/24/2014 Discharge date: 06/26/2014  Time spent: Less than 30 minutes  Recommendations for Outpatient Follow-up:  1. [name], NP/PCP in 3 days. 2. Recommend repeating TSH in 4-6 weeks.  Discharge Diagnoses:  Active Problems:   Stroke   Discharge Condition: Improved & Stable  Diet recommendation: Heart healthy diet.  Filed Weights   06/25/14 0832 06/26/14 0523  Weight: 71.578 kg (157 lb 12.8 oz) 71.775 kg (158 lb 3.8 oz)    History of present illness:  68 year old female patient with history of COPD, a lifelong nonsmoker, prior "mild stroke" without residual deficits, on aspirin 81 mg daily PTA, was in her usual state of health until approximately 7 PM on 06/24/14 when she experienced sudden onset of pain in the left mid thigh followed shortly thereafter by "explosive" pain, numbness and weakness of entire left half of the body. She was at the senior citizens [employer] with her spouse. No history of slurred speech or facial asymmetry. Although symptoms improved soon, she still has "weird sensation" of left side of body with associated mild weakness. She was seen at [facility] and advised by neurologist to transfer to [facility] for further evaluation. CTA head and neck were negative for acute findings.  Hospital Course:   1. Left-sided numbness, pain and weakness/? Complicated Migraine: Likely secondary to complicated migraine. CTA head and neck and MRI head without acute findings. Neurology consulted and stroke workup was completed which was unremarkable. On day of discharge, she had significant headache in the morning which improved after a dose of Toradol and Compazine. As discussed with neurology, she was discharged on PRN NSAIDs for headache. Neurology recommended no further neurologic intervention at this time. 2. COPD:  Stable 3. Hypokalemia: Replaced. 4. Dyslipidemia: Outpatient followup with PCP. 5. Mildly elevated TSH: Clinically euthyroid. Recommend repeating TSH in 4-6 weeks. 6. 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 Outpatient followup and evaluation as deemed necessary.   Consultations:  Neurology  Procedures:  None    Discharge Exam:  Complaints:  Severe headache this morning which improved to 3/10 after a dose of Toradol and Compazine. No history of chronic headaches. Numbness and weakness on the left side have almost resolved.[name] Vitals:   06/26/14 8119 06/26/14 0523 06/26/14 0818 06/26/14 1100  BP: 117/61 114/52 112/52 120/59  Pulse: 72 65 68 69  Temp: 97.9 F (36.6 C) 97.9 F (36.6 C) 97.6 F (36.4 C) 98.2 F (36.8 C)  TempSrc: Oral Oral Oral Oral  Resp: 18 20 16 15   Height:      Weight:  71.775 kg (158 lb 3.8 oz)    SpO2: 96% 97% 96% 98%  General exam: Pleasant middle-aged female lying comfortably in bed.  Respiratory system: Clear. No increased work of breathing.  Cardiovascular system: S1 & S2 heard, RRR. No JVD, murmurs, gallops, clicks or pedal edema. Telemetry: Sinus rhythm.  Gastrointestinal system: Abdomen is nondistended, soft and nontender. Normal bowel sounds heard.  Central nervous system: Alert and oriented. No cranial nerve deficits.  Extremities: Symmetric 5 x 5 power.    Discharge Instructions      Discharge Instructions   Call MD for:    Complete by:  As directed   Headache & worsening stroke like symptoms.     Diet - low sodium heart healthy    Complete by:  As directed      Increase activity slowly    Complete by:  As directed             Medication List         albuterol 108 (90 BASE) MCG/ACT inhaler  Commonly known as:  PROVENTIL HFA;VENTOLIN HFA  Inhale 1-2 puffs into the lungs every 6 (six) hours as needed for wheezing.     aspirin EC 81 MG tablet  Take 81 mg by  mouth daily.     fish oil-omega-3 fatty acids 1000 MG capsule  Take 1 g by mouth daily.     FLAX SEED OIL PO  Take 2 capsules by mouth daily.     ibuprofen 200 MG tablet  Commonly known as:  ADVIL  Take 3 tablets (600 mg total) by mouth every 8 (eight) hours as needed for headache.     prenatal multivitamin Tabs tablet  Take 1 tablet by mouth daily at 12 noon.       Follow-up Information   Follow up with Smothers, Cathleen Corti, NP. Schedule an appointment as soon as possible for a visit in 3 days.   Specialty:  Nurse Practitioner   Contact information:   [address] [place] [address] [phone]        The results of significant diagnostics from this hospitalization (including imaging, microbiology, ancillary and laboratory) are listed below for reference.    Significant Diagnostic Studies: Ct Angio Head W/cm &/or Wo Cm  06/25/2014   CLINICAL DATA:  Acute onset LEFT-sided weakness while dancing. Shortness of breath, symptoms improving.  EXAM: CT ANGIOGRAPHY HEAD AND NECK  TECHNIQUE: Multidetector CT imaging of the head and neck was performed using the standard protocol during bolus administration of intravenous contrast. Multiplanar CT image reconstructions and MIPs were obtained to evaluate the vascular anatomy. Carotid stenosis measurements (when applicable) are obtained utilizing NASCET criteria, using the distal internal carotid diameter as the denominator.  CONTRAST:  OMNIPAQUE IOHEXOL 350 MG/ML SOLN  COMPARISON:  MRA/MRI of the head December 29th 2007  FINDINGS: CT HEAD FINDINGS  The ventricles and sulci are normal for age. No intraparenchymal hemorrhage, mass effect nor midline shift. Patchy supratentorial white matter hypodensities are within normal range for patient's age and though non-specific suggest sequelae of chronic small vessel ischemic disease. Tubular probable mineralization LEFT inferior basal ganglia. No acute large vascular territory  infarcts. No abnormal parenchymal nor leptomeningeal enhancement.  No abnormal extra-axial fluid collections. Basal cisterns are patent.  No skull fracture. The included ocular globes and orbital contents are non-suspicious. The mastoid aircells and included paranasal sinuses are well-aerated. Soft tissue within the LEFT external auditory canal may reflect cerumen. Partially imaged C1-2 osteoarthrosis. Patient is edentulous.  CTA HEAD FINDINGS  Anterior circulation: Overall normal appearance of the cervical internal carotid arteries, petrous, cavernous and supra clinoid internal carotid arteries. 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 Widely patent anterior communicating artery. Normal appearance of the anterior and middle cerebral arteries.  Posterior circulation: LEFT vertebral artery is dominant with normal appearance of the vertebral arteries, vertebrobasilar junction and basilar artery, as well as main branch vessels. Normal appearance of the posterior cerebral arteries.  No large vessel occlusion, hemodynamically significant stenosis, dissection, luminal irregularity, contrast extravasation within the anterior nor posterior circulation.  Review of the MIP images confirms the above findings.  CTA NECK FINDINGS  Normal appearance of the thoracic arch, normal branch pattern. The  origins of the innominate, left Common carotid artery and subclavian artery are widely patent.  Bilateral Common carotid arteries are widely patent, coursing in a straight line fashion. Normal appearance of the carotid bifurcations without hemodynamically significant stenosis by NASCET criteria. Normal appearance of the included internal carotid arteries.  Left vertebral artery is dominant. Normal appearance of the vertebral arteries, which appear widely patent. Extrinsic compression of the LEFT vertebral artery due to uncovertebral hypertrophy and facet arthropathy.  No hemodynamically  significant stenosis by NASCET criteria. No dissection, no pseudoaneurysm. No abnormal luminal irregularity. No contrast extravasation.  Soft tissues are unremarkable. No acute osseous process though bone windows have not been submitted. ACDF. 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  Review of the MIP images confirms the above findings.  IMPRESSION: CT HEAD: No acute intracranial process. If ongoing concern for acute ischemia, MRI of the brain with diffusion-weighted sequences would be more sensitive.  Involutional changes. Mild white matter changes suggest chronic small vessel ischemic disease.  CTA HEAD: No acute vascular injury nor hemodynamically significant stenosis. 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  CTA NECK: No acute vascular injury nor hemodynamically significant stenosis.   Electronically Signed   By: Awilda Metroourtnay  Bloomer   On: 06/25/2014 02:35   Dg Chest 2 View  06/25/2014   CLINICAL DATA:  Shortness of breath, left-sided pain. History of COPD. Stroke.  EXAM: CHEST  2 VIEW  COMPARISON:  11/01/2012  FINDINGS: Stable elevation of the right hemidiaphragm. Heart and mediastinal contours are within normal limits. No focal opacities or effusions. No acute bony abnormality.  IMPRESSION: No active cardiopulmonary disease.   Electronically Signed   By: Charlett NoseKevin  Dover M.D.   On: 06/25/2014 13:35   Ct Angio Neck W/cm &/or Wo/cm  06/25/2014   CLINICAL DATA:  Acute onset LEFT-sided weakness while dancing. Shortness of breath, symptoms improving.  EXAM: CT ANGIOGRAPHY HEAD AND NECK  TECHNIQUE: Multidetector CT imaging of the head and neck was performed using the standard protocol during bolus administration of intravenous contrast. Multiplanar CT image reconstructions and MIPs were obtained to evaluate the vascular anatomy. Carotid stenosis measurements (when applicable) are obtained utilizing NASCET criteria, using the distal internal carotid diameter as the denominator.   CONTRAST:  100mL OMNIPAQUE IOHEXOL 350 MG/ML SOLN  COMPARISON:  MRA/MRI of the head December 29th 2007  FINDINGS: CT HEAD FINDINGS  The ventricles and sulci are normal for age. No intraparenchymal hemorrhage, mass effect nor midline shift. Patchy supratentorial white matter hypodensities are within normal range for patient's age and though non-specific suggest sequelae of chronic small vessel ischemic disease. Tubular probable mineralization LEFT inferior basal ganglia. No acute large vascular territory infarcts. No abnormal parenchymal nor leptomeningeal enhancement.  No abnormal extra-axial fluid collections. Basal cisterns are patent.  No skull fracture. The included ocular globes and orbital contents are non-suspicious. The mastoid aircells and included paranasal sinuses are well-aerated. Soft tissue within the LEFT external auditory canal may reflect cerumen. Partially imaged C1-2 osteoarthrosis. Patient is edentulous.  CTA HEAD FINDINGS  Anterior circulation: Overall normal appearance of the cervical internal carotid arteries, petrous, cavernous and supra clinoid internal carotid arteries. 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 Widely patent anterior communicating artery. Normal appearance of the anterior and middle cerebral arteries.  Posterior circulation: LEFT vertebral artery is dominant with normal appearance of the vertebral arteries, vertebrobasilar junction and basilar artery, as well as main branch vessels. Normal appearance of the posterior cerebral arteries.  No large vessel occlusion, hemodynamically significant stenosis, dissection, luminal irregularity, contrast extravasation within the anterior nor posterior circulation.  Review of the MIP images confirms the above findings.  CTA NECK FINDINGS  Normal appearance of the thoracic arch, normal branch pattern. The origins of the innominate, left Common carotid artery and subclavian artery are widely  patent.  Bilateral Common carotid arteries are widely patent, coursing in a straight line fashion. Normal appearance of the carotid bifurcations without hemodynamically significant stenosis by NASCET criteria. Normal appearance of the included internal carotid arteries.  Left vertebral artery is dominant. Normal appearance of the vertebral arteries, which appear widely patent. Extrinsic compression of the LEFT vertebral artery due to uncovertebral hypertrophy and facet arthropathy.  No hemodynamically significant stenosis by NASCET criteria. No dissection, no pseudoaneurysm. No abnormal luminal irregularity. No contrast extravasation.  Soft tissues are unremarkable. No acute osseous process though bone windows have not been submitted. ACDF. 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  Review of the MIP images confirms the above findings.  IMPRESSION: CT HEAD: No acute intracranial process. If ongoing concern for acute ischemia, MRI of the brain with diffusion-weighted sequences would be more sensitive.  Involutional changes. Mild white matter changes suggest chronic small vessel ischemic disease.  CTA HEAD: No acute vascular injury nor hemodynamically significant stenosis. 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  CTA NECK: No acute vascular injury nor hemodynamically significant stenosis.   Electronically Signed   By: Awilda Metroourtnay  Bloomer   On: 06/25/2014 02:35   Mr Brain Wo Contrast  06/25/2014   CLINICAL DATA:  68 year old female with acute left extremity numbness and paresthesias. Generalized headache. Near syncope. Initial encounter.  EXAM: MRI HEAD WITHOUT CONTRAST  MRA HEAD WITHOUT CONTRAST  TECHNIQUE: Multiplanar, multiecho pulse sequences of the brain and surrounding structures were obtained without intravenous contrast. Angiographic images of the head were obtained using MRA technique without contrast.  COMPARISON:  CTA head and neck 0156 hr the same day. Brain MRI and MRA  09/12/2006.  FINDINGS: MRI HEAD FINDINGS  Cerebral volume is not significantly changed since 2007. Major intracranial vascular flow voids are stable. No restricted diffusion to suggest acute infarction. No midline shift, mass effect, evidence of mass lesion, ventriculomegaly, extra-axial collection or acute intracranial hemorrhage. Cervicomedullary junction and pituitary are within normal limits. Negative visualized cervical spine except for partially visible ACDF hardware at C5. Normal bone marrow signal. Wallace CullensGray and white matter signal is stable since 2007 and within normal limits for age.  Visible internal auditory structures appear normal. Minor paranasal sinus mucosal thickening. Visualized orbit soft tissues are within normal limits. Visualized scalp soft tissues are within normal limits.  MRA HEAD FINDINGS  Stable antegrade flow in the posterior circulation with dominant distal left vertebral artery. Normal PICA origins. Normal vertebrobasilar junction. No basilar stenosis. Normal SCA and PCA origins. Diminutive posterior communicating arteries. Bilateral PCA branches are within normal limits.  Antegrade flow in both ICA siphons. Tortuous distal cervical left ICA. Unchanged tiny right lateral posterior cavernous ICA irregularity seen on series 5, image 94 (series 6, image 71 in 2007), favor atherosclerotic pseudo lesion. No ICA stenosis. Ophthalmic artery origins are within normal limits. Normal carotid termini, MCA and ACA origins. Diminutive or absent anterior communicating artery. Visualized bilateral ACA and MCA branches are within normal limits.  IMPRESSION: 1. No acute intracranial abnormality. Stable and negative for age non contrast MRI appearance of the brain since 2007. 2. Stable since 2007 and essentially negative for age intracranial MRA.  Electronically Signed   By: Augusto Gamble M.D.   On: 06/25/2014 11:50   Mr Maxine Glenn Head/brain Wo Cm  06/25/2014   CLINICAL DATA:  68 year old female with acute left  extremity numbness and paresthesias. Generalized headache. Near syncope. Initial encounter.  EXAM: MRI HEAD WITHOUT CONTRAST  MRA HEAD WITHOUT CONTRAST  TECHNIQUE: Multiplanar, multiecho pulse sequences of the brain and surrounding structures were obtained without intravenous contrast. Angiographic images of the head were obtained using MRA technique without contrast.  COMPARISON:  CTA head and neck 0156 hr the same day. Brain MRI and MRA 09/12/2006.  FINDINGS: MRI HEAD FINDINGS  Cerebral volume is not significantly changed since 2007. Major intracranial vascular flow voids are stable. No restricted diffusion to suggest acute infarction. No midline shift, mass effect, evidence of mass lesion, ventriculomegaly, extra-axial collection or acute intracranial hemorrhage. Cervicomedullary junction and pituitary are within normal limits. Negative visualized cervical spine except for partially visible ACDF hardware at C5. Normal bone marrow signal. Wallace Cullens and white matter signal is stable since 2007 and within normal limits for age.  Visible internal auditory structures appear normal. Minor paranasal sinus mucosal thickening. Visualized orbit soft tissues are within normal limits. Visualized scalp soft tissues are within normal limits.  MRA HEAD FINDINGS  Stable antegrade flow in the posterior circulation with dominant distal left vertebral artery. Normal PICA origins. Normal vertebrobasilar junction. No basilar stenosis. Normal SCA and PCA origins. Diminutive posterior communicating arteries. Bilateral PCA branches are within normal limits.  Antegrade flow in both ICA siphons. Tortuous distal cervical left ICA. Unchanged tiny right lateral posterior cavernous ICA irregularity seen on series 5, image 94 (series 6, image 71 in 2007), favor atherosclerotic pseudo lesion. No ICA stenosis. Ophthalmic artery origins are within normal limits. Normal carotid termini, MCA and ACA origins. Diminutive or absent anterior communicating  artery. Visualized bilateral ACA and MCA branches are within normal limits.  IMPRESSION: 1. No acute intracranial abnormality. Stable and negative for age non contrast MRI appearance of the brain since 2007. 2. Stable since 2007 and essentially negative for age intracranial MRA.   Electronically Signed   By: Augusto Gamble M.D.   On: 06/25/2014 11:50    Microbiology: No results found for this or any previous visit (from the past 240 hour(s)).   Labs: Basic Metabolic Panel:  Recent Labs Lab 06/24/14 2352 06/26/14 0425  NA 139 143  K 3.5* 3.9  CL 102 107  CO2 23 24  GLUCOSE 105* 95  BUN 21 11  CREATININE 0.62 0.69  CALCIUM 9.2 8.7   Liver Function Tests: No results found for this basename: AST, ALT, ALKPHOS, BILITOT, PROT, ALBUMIN,  in the last 168 hours No results found for this basename: LIPASE, AMYLASE,  in the last 168 hours No results found for this basename: AMMONIA,  in the last 168 hours CBC:  Recent Labs Lab 06/24/14 2352  WBC 7.7  NEUTROABS 5.1  HGB 12.1  HCT 35.0*  MCV 87.9  PLT 168   Cardiac Enzymes: No results found for this basename: CKTOTAL, CKMB, CKMBINDEX, TROPONINI,  in the last 168 hours BNP: BNP (last 3 results) No results found for this basename: PROBNP,  in the last 8760 hours CBG: No results found for this basename: GLUCAP,  in the last 168 hours  Additional labs: 1. Fasting lipids: Cholesterol 193, triglycerides 167, HDL 35, LDL 125 and VLDL 33 2. Hemoglobin A1c: 5.8 3. TSH: 5.860   Signed:  Marcellus Scott, MD, FACP, FHM. [facility] Pager [phone]  If 7PM-7AM, please contact night-coverage [email or web address] Password [email or web address] 06/26/2014, 3:04 PM

## 2014-06-26 NOTE — Progress Notes (Signed)
OT Cancellation Note  Patient Details Name: [name] MRN: [id number] DOB: 1946/08/27   Cancelled Treatment:    Reason Eval/Treat Not Completed: OT screened, no needs identified, will sign off  9Th Medical GroupWARD,HILLARY [place] Luppino, OTR/L  [phone] 06/26/2014 06/26/2014, 4:42 PM

## 2014-06-26 NOTE — Progress Notes (Signed)
Subjective: Patient reports that her left sided symptoms are improved but still continues.  She does report a headache though today that is on the right.  She reports that it is severe.     Objective: Current vital signs: BP 120/59  Pulse 69  Temp(Src) 98.2 F (36.8 C) (Oral)  Resp 15  Ht 5\' 3"  (1.6 m)  Wt 71.775 kg (158 lb 3.8 oz)  BMI 28.04 kg/m2  SpO2 98% Vital signs in last 24 hours: Temp:  [97.6 F (36.4 C)-98.4 F (36.9 C)] 98.2 F (36.8 C) (10/12 1100) Pulse Rate:  [65-73] 69 (10/12 1100) Resp:  [12-20] 15 (10/12 1100) BP: (112-130)/(52-71) 120/59 mmHg (10/12 1100) SpO2:  [96 %-99 %] 98 % (10/12 1100) Weight:  [71.775 kg (158 lb 3.8 oz)] 71.775 kg (158 lb 3.8 oz) (10/12 0523)  Intake/Output from previous day: 10/11 0701 - 10/12 0700 In: 480 [P.O.:480] Out: -  Intake/Output this shift: Total I/O In: 260 [P.O.:260] Out: -  Nutritional status: Cardiac  Neurologic Exam: Mental Status:  Alert, oriented, thought content appropriate. Speech fluent without evidence of aphasia. Able to follow 3 step commands without difficulty.  Cranial Nerves:  II: Discs flat bilaterally; Visual fields grossly normal, pupils equal, round, reactive to light and accommodation  III,IV, VI: ptosis not present, extra-ocular motions intact bilaterally  V,VII: smile symmetric, facial light touch sensation normal bilaterally  VIII: hearing normal bilaterally  IX,X: gag reflex present  XI: bilateral shoulder shrug  XII: midline tongue extension  Motor:  5/5 thoughout Tone and bulk:normal tone throughout; no atrophy noted  Sensory: Pinprick and light touch decreased on the left splitting the midline on the trunk  Deep Tendon Reflexes: 2+ and symmetric throughout  Plantars:  Right: downgoing   Left: downgoing  Cerebellar:  normal finger-to-nose and normal heel-to-shin testing bilaterally  Gait: unable to test  CV: pulses palpable throughout      Lab Results: Basic Metabolic  Panel:  Recent Labs Lab 06/24/14 2352 06/26/14 0425  NA 139 143  K 3.5* 3.9  CL 102 107  CO2 23 24  GLUCOSE 105* 95  BUN 21 11  CREATININE 0.62 0.69  CALCIUM 9.2 8.7    Liver Function Tests: No results found for this basename: AST, ALT, ALKPHOS, BILITOT, PROT, ALBUMIN,  in the last 168 hours No results found for this basename: LIPASE, AMYLASE,  in the last 168 hours No results found for this basename: AMMONIA,  in the last 168 hours  CBC:  Recent Labs Lab 06/24/14 2352  WBC 7.7  NEUTROABS 5.1  HGB 12.1  HCT 35.0*  MCV 87.9  PLT 168    Cardiac Enzymes: No results found for this basename: CKTOTAL, CKMB, CKMBINDEX, TROPONINI,  in the last 168 hours  Lipid Panel:  Recent Labs Lab 06/26/14 0425  CHOL 193  TRIG 167*  HDL 35*  CHOLHDL 5.5  VLDL 33  LDLCALC 562125*    CBG: No results found for this basename: GLUCAP,  in the last 168 hours  Microbiology: Results for orders placed during the hospital encounter of 10/15/13  RAPID STREP SCREEN     Status: None   Collection Time    10/15/13  6:02 PM      Result Value Ref Range Status   Streptococcus, Group A Screen (Direct) NEGATIVE  NEGATIVE Final   Comment: (NOTE)     A Rapid Antigen test may result negative if the antigen level in the     sample is below the detection  level of this test. The FDA has not     cleared this test as a stand-alone test therefore the rapid antigen     negative result has reflexed to a Group A Strep culture.  CULTURE, GROUP A STREP     Status: None   Collection Time    10/15/13  6:02 PM      Result Value Ref Range Status   Specimen Description THROAT   Final   Special Requests NONE   Final   Culture     Final   Value: No Beta Hemolytic Streptococci Isolated     Performed at [facility]   Report Status 10/17/2013 FINAL   Final    Coagulation Studies: No results found for this basename: LABPROT, INR,  in the last 72 hours  Imaging: Ct Angio Head W/cm &/or Wo  Cm  06/25/2014   CLINICAL DATA:  Acute onset LEFT-sided weakness while dancing. Shortness of breath, symptoms improving.  EXAM: CT ANGIOGRAPHY HEAD AND NECK  TECHNIQUE: Multidetector CT imaging of the head and neck was performed using the standard protocol during bolus administration of intravenous contrast. Multiplanar CT image reconstructions and MIPs were obtained to evaluate the vascular anatomy. Carotid stenosis measurements (when applicable) are obtained utilizing NASCET criteria, using the distal internal carotid diameter as the denominator.  CONTRAST:  OMNIPAQUE IOHEXOL 350 MG/ML SOLN  COMPARISON:  MRA/MRI of the head December 29th 2007  FINDINGS: CT HEAD FINDINGS  The ventricles and sulci are normal for age. No intraparenchymal hemorrhage, mass effect nor midline shift. Patchy supratentorial white matter hypodensities are within normal range for patient's age and though non-specific suggest sequelae of chronic small vessel ischemic disease. Tubular probable mineralization LEFT inferior basal ganglia. No acute large vascular territory infarcts. No abnormal parenchymal nor leptomeningeal enhancement.  No abnormal extra-axial fluid collections. Basal cisterns are patent.  No skull fracture. The included ocular globes and orbital contents are non-suspicious. The mastoid aircells and included paranasal sinuses are well-aerated. Soft tissue within the LEFT external auditory canal may reflect cerumen. Partially imaged C1-2 osteoarthrosis. Patient is edentulous.  CTA HEAD FINDINGS  Anterior circulation: Overall normal appearance of the cervical internal carotid arteries, petrous, cavernous and supra clinoid internal carotid arteries. 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 Widely patent anterior communicating artery. Normal appearance of the anterior and middle cerebral arteries.  Posterior circulation: LEFT vertebral artery is dominant with normal appearance  of the vertebral arteries, vertebrobasilar junction and basilar artery, as well as main branch vessels. Normal appearance of the posterior cerebral arteries.  No large vessel occlusion, hemodynamically significant stenosis, dissection, luminal irregularity, contrast extravasation within the anterior nor posterior circulation.  Review of the MIP images confirms the above findings.  CTA NECK FINDINGS  Normal appearance of the thoracic arch, normal branch pattern. The origins of the innominate, left Common carotid artery and subclavian artery are widely patent.  Bilateral Common carotid arteries are widely patent, coursing in a straight line fashion. Normal appearance of the carotid bifurcations without hemodynamically significant stenosis by NASCET criteria. Normal appearance of the included internal carotid arteries.  Left vertebral artery is dominant. Normal appearance of the vertebral arteries, which appear widely patent. Extrinsic compression of the LEFT vertebral artery due to uncovertebral hypertrophy and facet arthropathy.  No hemodynamically significant stenosis by NASCET criteria. No dissection, no pseudoaneurysm. No abnormal luminal irregularity. No contrast extravasation.  Soft tissues are unremarkable. No acute osseous process though bone windows have  not been submitted. ACDF. 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  Review of the MIP images confirms the above findings.  IMPRESSION: CT HEAD: No acute intracranial process. If ongoing concern for acute ischemia, MRI of the brain with diffusion-weighted sequences would be more sensitive.  Involutional changes. Mild white matter changes suggest chronic small vessel ischemic disease.  CTA HEAD: No acute vascular injury nor hemodynamically significant stenosis. 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  CTA NECK: No acute vascular injury nor hemodynamically significant stenosis.   Electronically Signed   By: Awilda Metro    On: 06/25/2014 02:35   Dg Chest 2 View  06/25/2014   CLINICAL DATA:  Shortness of breath, left-sided pain. History of COPD. Stroke.  EXAM: CHEST  2 VIEW  COMPARISON:  11/01/2012  FINDINGS: Stable elevation of the right hemidiaphragm. Heart and mediastinal contours are within normal limits. No focal opacities or effusions. No acute bony abnormality.  IMPRESSION: No active cardiopulmonary disease.   Electronically Signed   By: Charlett Nose M.D.   On: 06/25/2014 13:35   Ct Angio Neck W/cm &/or Wo/cm  06/25/2014   CLINICAL DATA:  Acute onset LEFT-sided weakness while dancing. Shortness of breath, symptoms improving.  EXAM: CT ANGIOGRAPHY HEAD AND NECK  TECHNIQUE: Multidetector CT imaging of the head and neck was performed using the standard protocol during bolus administration of intravenous contrast. Multiplanar CT image reconstructions and MIPs were obtained to evaluate the vascular anatomy. Carotid stenosis measurements (when applicable) are obtained utilizing NASCET criteria, using the distal internal carotid diameter as the denominator.  CONTRAST:  OMNIPAQUE IOHEXOL 350 MG/ML SOLN  COMPARISON:  MRA/MRI of the head December 29th 2007  FINDINGS: CT HEAD FINDINGS  The ventricles and sulci are normal for age. No intraparenchymal hemorrhage, mass effect nor midline shift. Patchy supratentorial white matter hypodensities are within normal range for patient's age and though non-specific suggest sequelae of chronic small vessel ischemic disease. Tubular probable mineralization LEFT inferior basal ganglia. No acute large vascular territory infarcts. No abnormal parenchymal nor leptomeningeal enhancement.  No abnormal extra-axial fluid collections. Basal cisterns are patent.  No skull fracture. The included ocular globes and orbital contents are non-suspicious. The mastoid aircells and included paranasal sinuses are well-aerated. Soft tissue within the LEFT external auditory canal may reflect cerumen. Partially  imaged C1-2 osteoarthrosis. Patient is edentulous.  CTA HEAD FINDINGS  Anterior circulation: Overall normal appearance of the cervical internal carotid arteries, petrous, cavernous and supra clinoid internal carotid arteries. 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 Widely patent anterior communicating artery. Normal appearance of the anterior and middle cerebral arteries.  Posterior circulation: LEFT vertebral artery is dominant with normal appearance of the vertebral arteries, vertebrobasilar junction and basilar artery, as well as main branch vessels. Normal appearance of the posterior cerebral arteries.  No large vessel occlusion, hemodynamically significant stenosis, dissection, luminal irregularity, contrast extravasation within the anterior nor posterior circulation.  Review of the MIP images confirms the above findings.  CTA NECK FINDINGS  Normal appearance of the thoracic arch, normal branch pattern. The origins of the innominate, left Common carotid artery and subclavian artery are widely patent.  Bilateral Common carotid arteries are widely patent, coursing in a straight line fashion. Normal appearance of the carotid bifurcations without hemodynamically significant stenosis by NASCET criteria. Normal appearance of the included internal carotid arteries.  Left vertebral artery is dominant. Normal appearance of the vertebral arteries, which appear widely patent. Extrinsic compression of  the LEFT vertebral artery due to uncovertebral hypertrophy and facet arthropathy.  No hemodynamically significant stenosis by NASCET criteria. No dissection, no pseudoaneurysm. No abnormal luminal irregularity. No contrast extravasation.  Soft tissues are unremarkable. No acute osseous process though bone windows have not been submitted. ACDF. 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  Review of the MIP images confirms the above findings.  IMPRESSION: CT HEAD: No acute intracranial process.  If ongoing concern for acute ischemia, MRI of the brain with diffusion-weighted sequences would be more sensitive.  Involutional changes. Mild white matter changes suggest chronic small vessel ischemic disease.  CTA HEAD: No acute vascular injury nor hemodynamically significant stenosis. 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  CTA NECK: No acute vascular injury nor hemodynamically significant stenosis.   Electronically Signed   By: Awilda Metroourtnay  Bloomer   On: 06/25/2014 02:35   Mr Brain Wo Contrast  06/25/2014   CLINICAL DATA:  68 year old female with acute left extremity numbness and paresthesias. Generalized headache. Near syncope. Initial encounter.  EXAM: MRI HEAD WITHOUT CONTRAST  MRA HEAD WITHOUT CONTRAST  TECHNIQUE: Multiplanar, multiecho pulse sequences of the brain and surrounding structures were obtained without intravenous contrast. Angiographic images of the head were obtained using MRA technique without contrast.  COMPARISON:  CTA head and neck 0156 hr the same day. Brain MRI and MRA 09/12/2006.  FINDINGS: MRI HEAD FINDINGS  Cerebral volume is not significantly changed since 2007. Major intracranial vascular flow voids are stable. No restricted diffusion to suggest acute infarction. No midline shift, mass effect, evidence of mass lesion, ventriculomegaly, extra-axial collection or acute intracranial hemorrhage. Cervicomedullary junction and pituitary are within normal limits. Negative visualized cervical spine except for partially visible ACDF hardware at C5. Normal bone marrow signal. Wallace CullensGray and white matter signal is stable since 2007 and within normal limits for age.  Visible internal auditory structures appear normal. Minor paranasal sinus mucosal thickening. Visualized orbit soft tissues are within normal limits. Visualized scalp soft tissues are within normal limits.  MRA HEAD FINDINGS  Stable antegrade flow in the posterior circulation with dominant  distal left vertebral artery. Normal PICA origins. Normal vertebrobasilar junction. No basilar stenosis. Normal SCA and PCA origins. Diminutive posterior communicating arteries. Bilateral PCA branches are within normal limits.  Antegrade flow in both ICA siphons. Tortuous distal cervical left ICA. Unchanged tiny right lateral posterior cavernous ICA irregularity seen on series 5, image 94 (series 6, image 71 in 2007), favor atherosclerotic pseudo lesion. No ICA stenosis. Ophthalmic artery origins are within normal limits. Normal carotid termini, MCA and ACA origins. Diminutive or absent anterior communicating artery. Visualized bilateral ACA and MCA branches are within normal limits.  IMPRESSION: 1. No acute intracranial abnormality. Stable and negative for age non contrast MRI appearance of the brain since 2007. 2. Stable since 2007 and essentially negative for age intracranial MRA.   Electronically Signed   By: Augusto GambleLee  Hall M.D.   On: 06/25/2014 11:50   Mr Maxine GlennMra Head/brain Wo Cm  06/25/2014   CLINICAL DATA:  68 year old female with acute left extremity numbness and paresthesias. Generalized headache. Near syncope. Initial encounter.  EXAM: MRI HEAD WITHOUT CONTRAST  MRA HEAD WITHOUT CONTRAST  TECHNIQUE: Multiplanar, multiecho pulse sequences of the brain and surrounding structures were obtained without intravenous contrast. Angiographic images of the head were obtained using MRA technique without contrast.  COMPARISON:  CTA head and neck 0156 hr the same day. Brain MRI and MRA 09/12/2006.  FINDINGS: MRI HEAD FINDINGS  Cerebral volume is not  significantly changed since 2007. Major intracranial vascular flow voids are stable. No restricted diffusion to suggest acute infarction. No midline shift, mass effect, evidence of mass lesion, ventriculomegaly, extra-axial collection or acute intracranial hemorrhage. Cervicomedullary junction and pituitary are within normal limits. Negative visualized cervical spine except for  partially visible ACDF hardware at C5. Normal bone marrow signal. Wallace Cullens and white matter signal is stable since 2007 and within normal limits for age.  Visible internal auditory structures appear normal. Minor paranasal sinus mucosal thickening. Visualized orbit soft tissues are within normal limits. Visualized scalp soft tissues are within normal limits.  MRA HEAD FINDINGS  Stable antegrade flow in the posterior circulation with dominant distal left vertebral artery. Normal PICA origins. Normal vertebrobasilar junction. No basilar stenosis. Normal SCA and PCA origins. Diminutive posterior communicating arteries. Bilateral PCA branches are within normal limits.  Antegrade flow in both ICA siphons. Tortuous distal cervical left ICA. Unchanged tiny right lateral posterior cavernous ICA irregularity seen on series 5, image 94 (series 6, image 71 in 2007), favor atherosclerotic pseudo lesion. No ICA stenosis. Ophthalmic artery origins are within normal limits. Normal carotid termini, MCA and ACA origins. Diminutive or absent anterior communicating artery. Visualized bilateral ACA and MCA branches are within normal limits.  IMPRESSION: 1. No acute intracranial abnormality. Stable and negative for age non contrast MRI appearance of the brain since 2007. 2. Stable since 2007 and essentially negative for age intracranial MRA.   Electronically Signed   By: Augusto Gamble M.D.   On: 06/25/2014 11:50    Medications:  I have reviewed the patient's current medications. Scheduled: . aspirin EC  325 mg Oral Daily  . enoxaparin (LOVENOX) injection  40 mg Subcutaneous Q24H    Assessment/Plan: Patient now with a headache.  Other symptoms improved but continue.  Work up has been unremarkable.  Symptoms may be related to a complicated migraine.    Recommendations: 1.  Toradol 30mg , Compazine 10mg  now 2.  No further neurologic intervention is recommended at this time.  If further questions arise, please call or page at that  time.  Thank you for allowing neurology to participate in the care of this patient.  Patient may follow up on an outpatient.     LOS: 2 days   Thana Farr, MD [facility] [phone] 06/26/2014  11:13 AM

## 2015-01-12 ENCOUNTER — Emergency Department (HOSPITAL_COMMUNITY): Payer: Medicare Other

## 2015-01-12 ENCOUNTER — Encounter (HOSPITAL_COMMUNITY): Payer: Self-pay | Admitting: Emergency Medicine

## 2015-01-12 ENCOUNTER — Emergency Department (HOSPITAL_COMMUNITY)
Admission: EM | Admit: 2015-01-12 | Discharge: 2015-01-13 | Disposition: A | Discharge status: Home / Self Care | Payer: Medicare Other | Attending: Emergency Medicine | Admitting: Emergency Medicine

## 2015-01-12 DIAGNOSIS — M1990 Unspecified osteoarthritis, unspecified site: Secondary | ICD-10-CM | POA: Diagnosis not present

## 2015-01-12 DIAGNOSIS — R079 Chest pain, unspecified: Secondary | ICD-10-CM

## 2015-01-12 DIAGNOSIS — T7840XA Allergy, unspecified, initial encounter: Secondary | ICD-10-CM | POA: Diagnosis not present

## 2015-01-12 DIAGNOSIS — Z7982 Long term (current) use of aspirin: Secondary | ICD-10-CM | POA: Diagnosis not present

## 2015-01-12 DIAGNOSIS — Z8619 Personal history of other infectious and parasitic diseases: Secondary | ICD-10-CM | POA: Insufficient documentation

## 2015-01-12 DIAGNOSIS — J441 Chronic obstructive pulmonary disease with (acute) exacerbation: Secondary | ICD-10-CM | POA: Diagnosis not present

## 2015-01-12 DIAGNOSIS — Z79899 Other long term (current) drug therapy: Secondary | ICD-10-CM | POA: Diagnosis not present

## 2015-01-12 LAB — CBC
HCT: 38.9 % (ref 36.0–46.0)
HEMOGLOBIN: 13.2 g/dL (ref 12.0–15.0)
MCH: 30.3 pg (ref 26.0–34.0)
MCHC: 33.9 g/dL (ref 30.0–36.0)
MCV: 89.2 fL (ref 78.0–100.0)
PLATELETS: 184 10*3/uL (ref 150–400)
RBC: 4.36 MIL/uL (ref 3.87–5.11)
RDW: 13.6 % (ref 11.5–15.5)
WBC: 8.5 10*3/uL (ref 4.0–10.5)

## 2015-01-12 LAB — I-STAT TROPONIN, ED: Troponin i, poc: 0 ng/mL (ref 0.00–0.08)

## 2015-01-12 LAB — BRAIN NATRIURETIC PEPTIDE: B NATRIURETIC PEPTIDE 5: 21.7 pg/mL (ref 0.0–100.0)

## 2015-01-12 NOTE — ED Notes (Addendum)
Pt from home c/o left chest pain, nausea and left arm pain since about 9 pm this evening. She describes pain as dull. Not a smoker. She reports taking 2 baby ASA this am and 2 baby ASA this evening when pain started.

## 2015-01-13 LAB — BASIC METABOLIC PANEL
ANION GAP: 10 (ref 5–15)
BUN: 11 mg/dL (ref 6–23)
CALCIUM: 9.4 mg/dL (ref 8.4–10.5)
CO2: 23 mmol/L (ref 19–32)
CREATININE: 0.7 mg/dL (ref 0.50–1.10)
Chloride: 109 mmol/L (ref 96–112)
GFR calc Af Amer: >90 mL/min (ref >90)
GFR, EST NON AFRICAN AMERICAN: 87 mL/min — AB (ref >90)
Glucose, Bld: 104 mg/dL — ABNORMAL HIGH (ref 70–99)
Potassium: 3.6 mmol/L (ref 3.5–5.1)
Sodium: 142 mmol/L (ref 135–145)

## 2015-01-13 MED ORDER — PREDNISONE 20 MG PO TABS
60.0000 mg | ORAL_TABLET | Freq: Once | ORAL | Status: AC
  Administered 2015-01-13: 60 mg via ORAL
  Filled 2015-01-13: qty 3

## 2015-01-13 MED ORDER — PREDNISONE 20 MG PO TABS: 40.0000 mg | ORAL_TABLET | Freq: Every day | ORAL | Status: DC

## 2015-01-13 NOTE — ED Provider Notes (Signed)
CSN: [id number]     Arrival date & time 01/12/15  2219 History   First MD Initiated Contact with Patient 01/13/15 0104     Chief Complaint  Patient presents with  . Chest Pain     (Consider location/radiation/quality/duration/timing/severity/associated sxs/prior Treatment) HPI Comments: Vision states that after eating blackberry jam at 9 AM she developed acute shortness of breath, chest pressure, feeling like she could not swallow. It took several minutes before this sensation decreased since that time she has had persistent intermittent episodes of feeling lightheaded. She sleep or had any reaction like this to food before she did not take any medication to alleviate her symptoms  Patient is a 69 y.o. female presenting with chest pain. The history is provided by the patient.  Chest Pain Pain location:  Unable to specify Pain quality: tightness   Pain radiates to:  Does not radiate Pain radiates to the back: no   Pain severity:  Mild Onset quality:  Sudden Progression:  Resolved Chronicity:  New Context: not breathing   Relieved by:  Rest Associated symptoms: dysphagia and shortness of breath   Associated symptoms: no fever and no nausea     Past Medical History  Diagnosis Date  . Arthritis   . COPD (chronic obstructive pulmonary disease)   . Shingles    Past Surgical History  Procedure Laterality Date  . Abdominal hysterectomy    . Tonsillectomy    . Appendectomy    . Cervical spine surgery     No family history on file. History  Substance Use Topics  . Smoking status: Never Smoker   . Smokeless tobacco: Not on file  . Alcohol Use: No   OB History    No data available     Review of Systems  Constitutional: Negative for fever.  HENT: Positive for trouble swallowing.   Respiratory: Positive for shortness of breath. Negative for chest tightness, wheezing and stridor.   Cardiovascular: Positive for chest pain. Negative for leg swelling.  Gastrointestinal:  Negative for nausea.  Neurological: Positive for light-headedness.  All other systems reviewed and are negative.     Allergies  Morphine and related; Promethazine hcl; and Zoloft  Home Medications   Prior to Admission medications   Medication Sig Start Date End Date Taking? Authorizing Provider  albuterol (PROVENTIL HFA;VENTOLIN HFA) 108 (90 BASE) MCG/ACT inhaler Inhale 1-2 puffs into the lungs every 6 (six) hours as needed for wheezing. 11/01/12  Yes Gerhard Munch, MD  aspirin EC 81 MG tablet Take 81 mg by mouth daily.    Yes Historical Provider, MD  fish oil-omega-3 fatty acids 1000 MG capsule Take 1 g by mouth daily.    Yes Historical Provider, MD  Flaxseed, Linseed, (FLAX SEED OIL PO) Take 2 capsules by mouth daily.    Yes Historical Provider, MD  ibuprofen (ADVIL) 200 MG tablet Take 3 tablets (600 mg total) by mouth every 8 (eight) hours as needed for headache. 06/26/14   Elease Etienne, MD  predniSONE (DELTASONE) 20 MG tablet Take 2 tablets (40 mg total) by mouth daily with breakfast. 01/13/15   Earley Favor, NP  Prenatal Vit-Fe Fumarate-FA (PRENATAL MULTIVITAMIN) TABS tablet Take 1 tablet by mouth daily at 12 noon.    Historical Provider, MD   BP 137/62 mmHg  Pulse 63  Temp(Src) 97.8 F (36.6 C) (Oral)  Resp 14  Wt 161 lb (73.029 kg)  SpO2 98% Physical Exam  Constitutional: She is oriented to person, place, and time. She appears  well-developed and well-nourished.  HENT:  Head: Normocephalic.  Right Ear: External ear normal.  Left Ear: External ear normal.  Mouth/Throat: Oropharynx is clear and moist.  Eyes: Pupils are equal, round, and reactive to light.  Neck: Normal range of motion.  Cardiovascular: Normal rate and regular rhythm.   Pulmonary/Chest: Effort normal and breath sounds normal.  Abdominal: Soft. She exhibits no distension.  Lymphadenopathy:    She has no cervical adenopathy.  Neurological: She is alert and oriented to person, place, and time.  Skin:  Skin is warm. No rash noted. No erythema.  Nursing note and vitals reviewed.   ED Course  Procedures (including critical care time) Labs Review Labs Reviewed  BASIC METABOLIC PANEL - Abnormal; Notable for the following:    Glucose, Bld 104 (*)    GFR calc non Af Amer 87 (*)    All other components within normal limits  CBC  BRAIN NATRIURETIC PEPTIDE  I-STAT TROPOININ, ED    Imaging Review Dg Chest 2 View  01/12/2015   CLINICAL DATA:  Chest pain.  COPD.  EXAM: CHEST  2 VIEW  COMPARISON:  06/25/2014  FINDINGS: Lower cervical spine fixation. Moderate right hemidiaphragm elevation. Midline trachea. Normal heart size and mediastinal contours. No pleural effusion or pneumothorax. Clear lungs.  IMPRESSION: No acute cardiopulmonary disease.   Electronically Signed   By: Jeronimo GreavesKyle  Talbot M.D.   On: 01/12/2015 23:20     EKG Interpretation   Date/Time:  Friday January 12 2015 22:25:26 EDT Ventricular Rate:  77 PR Interval:  159 QRS Duration: 141 QT Interval:  423 QTC Calculation: 479 R Axis:   86 Text Interpretation:  Sinus rhythm Left bundle branch block Probable  anteroseptal infarct, recent Baseline wander in lead(s) II III aVR aVL aVF  New since previous tracing Confirmed by JACUBOWITZ  MD, SAM [phone]([id number]) on  01/13/2015 12:46:37 AM      Cardiac markers, tropin  0.  Her EKG is normal, I feel this was an acute allergic reaction. Will treat with five-day course of steroids have patient follow-up with her primary care physician and avoid blueberry jam  in the future MDM   Final diagnoses:  Allergic reaction, initial encounter         Earley FavorGail Lewanna Petrak, NP 01/13/15 0131  Earley FavorGail Jim Philemon, NP 01/13/15 [phone]  Tomasita CrumbleAdeleke Oni, MD 01/13/15 [phone]

## 2015-01-13 NOTE — Discharge Instructions (Signed)
Allergies °Allergies may happen from anything your body is sensitive to. This may be food, medicines, pollens, chemicals, and nearly anything around you in everyday life that produces allergens. An allergen is anything that causes an allergy producing substance. Heredity is often a factor in causing these problems. This means you may have some of the same allergies as your parents. °Food allergies happen in all age groups. Food allergies are some of the most severe and life threatening. Some common food allergies are cow's milk, seafood, eggs, nuts, wheat, and soybeans. °SYMPTOMS  °· Swelling around the mouth. °· An itchy red rash or hives. °· Vomiting or diarrhea. °· Difficulty breathing. °SEVERE ALLERGIC REACTIONS ARE LIFE-THREATENING. °This reaction is called anaphylaxis. It can cause the mouth and throat to swell and cause difficulty with breathing and swallowing. In severe reactions only a trace amount of food (for example, peanut oil in a salad) may cause death within seconds. °Seasonal allergies occur in all age groups. These are seasonal because they usually occur during the same season every year. They may be a reaction to molds, grass pollens, or tree pollens. Other causes of problems are house dust mite allergens, pet dander, and mold spores. The symptoms often consist of nasal congestion, a runny itchy nose associated with sneezing, and tearing itchy eyes. There is often an associated itching of the mouth and ears. The problems happen when you come in contact with pollens and other allergens. Allergens are the particles in the air that the body reacts to with an allergic reaction. This causes you to release allergic antibodies. Through a chain of events, these eventually cause you to release histamine into the blood stream. Although it is meant to be protective to the body, it is this release that causes your discomfort. This is why you were given anti-histamines to feel better.  If you are unable to  pinpoint the offending allergen, it may be determined by skin or blood testing. Allergies cannot be cured but can be controlled with medicine. °Hay fever is a collection of all or some of the seasonal allergy problems. It may often be treated with simple over-the-counter medicine such as diphenhydramine. Take medicine as directed. Do not drink alcohol or drive while taking this medicine. Check with your caregiver or package insert for child dosages. °If these medicines are not effective, there are many new medicines your caregiver can prescribe. Stronger medicine such as nasal spray, eye drops, and corticosteroids may be used if the first things you try do not work well. Other treatments such as immunotherapy or desensitizing injections can be used if all else fails. Follow up with your caregiver if problems continue. These seasonal allergies are usually not life threatening. They are generally more of a nuisance that can often be handled using medicine. °HOME CARE INSTRUCTIONS  °· If unsure what causes a reaction, keep a diary of foods eaten and symptoms that follow. Avoid foods that cause reactions. °· If hives or rash are present: °¨ Take medicine as directed. °¨ You may use an over-the-counter antihistamine (diphenhydramine) for hives and itching as needed. °¨ Apply cold compresses (cloths) to the skin or take baths in cool water. Avoid hot baths or showers. Heat will make a rash and itching worse. °· If you are severely allergic: °¨ Following a treatment for a severe reaction, hospitalization is often required for closer follow-up. °¨ Wear a medic-alert bracelet or necklace stating the allergy. °¨ You and your family must learn how to give adrenaline or use   an anaphylaxis kit.  If you have had a severe reaction, always carry your anaphylaxis kit or EpiPen with you. Use this medicine as directed by your caregiver if a severe reaction is occurring. Failure to do so could have a fatal outcome. SEEK MEDICAL  CARE IF:  You suspect a food allergy. Symptoms generally happen within 30 minutes of eating a food.  Your symptoms have not gone away within 2 days or are getting worse.  You develop new symptoms.  You want to retest yourself or your child with a food or drink you think causes an allergic reaction. Never do this if an anaphylactic reaction to that food or drink has happened before. Only do this under the care of a caregiver. SEEK IMMEDIATE MEDICAL CARE IF:   You have difficulty breathing, are wheezing, or have a tight feeling in your chest or throat.  You have a swollen mouth, or you have hives, swelling, or itching all over your body.  You have had a severe reaction that has responded to your anaphylaxis kit or an EpiPen. These reactions may return when the medicine has worn off. These reactions should be considered life threatening. MAKE SURE YOU:   Understand these instructions.  Will watch your condition.  Will get help right away if you are not doing well or get worse. Document Released: 11/25/2002 Document Revised: 12/27/2012 Document Reviewed: 05/01/2008 [facility] Patient Information 2015 [place], [place]. This information is not intended to replace advice given to you by your health care provider. Make sure you discuss any questions you have with your health care provider. Please take the prednisone as directed until all tablets have been completed.  Make an appointment with your primary care physician for further evaluation as needed

## 2015-01-13 NOTE — ED Notes (Signed)
NP at bedside.

## 2015-01-25 ENCOUNTER — Emergency Department (HOSPITAL_COMMUNITY)
Admission: EM | Admit: 2015-01-25 | Discharge: 2015-01-25 | Disposition: A | Discharge status: Home / Self Care | Payer: Medicare Other | Attending: Emergency Medicine | Admitting: Emergency Medicine

## 2015-01-25 ENCOUNTER — Encounter (HOSPITAL_COMMUNITY): Payer: Self-pay | Admitting: Emergency Medicine

## 2015-01-25 ENCOUNTER — Emergency Department (HOSPITAL_COMMUNITY): Payer: Medicare Other

## 2015-01-25 DIAGNOSIS — R1031 Right lower quadrant pain: Secondary | ICD-10-CM | POA: Insufficient documentation

## 2015-01-25 DIAGNOSIS — Z7982 Long term (current) use of aspirin: Secondary | ICD-10-CM | POA: Insufficient documentation

## 2015-01-25 DIAGNOSIS — M5430 Sciatica, unspecified side: Secondary | ICD-10-CM | POA: Diagnosis not present

## 2015-01-25 DIAGNOSIS — Z8619 Personal history of other infectious and parasitic diseases: Secondary | ICD-10-CM | POA: Diagnosis not present

## 2015-01-25 DIAGNOSIS — Z79899 Other long term (current) drug therapy: Secondary | ICD-10-CM | POA: Diagnosis not present

## 2015-01-25 DIAGNOSIS — J449 Chronic obstructive pulmonary disease, unspecified: Secondary | ICD-10-CM | POA: Insufficient documentation

## 2015-01-25 DIAGNOSIS — M1990 Unspecified osteoarthritis, unspecified site: Secondary | ICD-10-CM | POA: Insufficient documentation

## 2015-01-25 DIAGNOSIS — M545 Low back pain: Secondary | ICD-10-CM | POA: Diagnosis present

## 2015-01-25 DIAGNOSIS — M549 Dorsalgia, unspecified: Secondary | ICD-10-CM

## 2015-01-25 LAB — URINALYSIS, ROUTINE W REFLEX MICROSCOPIC
BILIRUBIN URINE: NEGATIVE
Glucose, UA: NEGATIVE mg/dL
Hgb urine dipstick: NEGATIVE
KETONES UR: NEGATIVE mg/dL
Nitrite: NEGATIVE
PROTEIN: NEGATIVE mg/dL
Specific Gravity, Urine: 1.021 (ref 1.005–1.030)
Urobilinogen, UA: 0.2 mg/dL (ref 0.0–1.0)
pH: 5 (ref 5.0–8.0)

## 2015-01-25 LAB — CBC WITH DIFFERENTIAL/PLATELET
BASOS PCT: 0 % (ref 0–1)
Basophils Absolute: 0 10*3/uL (ref 0.0–0.1)
EOS PCT: 2 % (ref 0–5)
Eosinophils Absolute: 0.2 10*3/uL (ref 0.0–0.7)
HCT: 37.9 % (ref 36.0–46.0)
Hemoglobin: 13 g/dL (ref 12.0–15.0)
LYMPHS ABS: 2 10*3/uL (ref 0.7–4.0)
Lymphocytes Relative: 26 % (ref 12–46)
MCH: 30.7 pg (ref 26.0–34.0)
MCHC: 34.3 g/dL (ref 30.0–36.0)
MCV: 89.4 fL (ref 78.0–100.0)
Monocytes Absolute: 0.8 10*3/uL (ref 0.1–1.0)
Monocytes Relative: 11 % (ref 3–12)
Neutro Abs: 4.5 10*3/uL (ref 1.7–7.7)
Neutrophils Relative %: 61 % (ref 43–77)
PLATELETS: 185 10*3/uL (ref 150–400)
RBC: 4.24 MIL/uL (ref 3.87–5.11)
RDW: 13.5 % (ref 11.5–15.5)
WBC: 7.5 10*3/uL (ref 4.0–10.5)

## 2015-01-25 LAB — BASIC METABOLIC PANEL
ANION GAP: 11 (ref 5–15)
BUN: 21 mg/dL — ABNORMAL HIGH (ref 6–20)
CALCIUM: 9 mg/dL (ref 8.9–10.3)
CHLORIDE: 107 mmol/L (ref 101–111)
CO2: 22 mmol/L (ref 22–32)
Creatinine, Ser: 0.74 mg/dL (ref 0.44–1.00)
GFR calc non Af Amer: >60 mL/min (ref >60)
Glucose, Bld: 115 mg/dL — ABNORMAL HIGH (ref 65–99)
Potassium: 4.1 mmol/L (ref 3.5–5.1)
SODIUM: 140 mmol/L (ref 135–145)

## 2015-01-25 LAB — URINE MICROSCOPIC-ADD ON

## 2015-01-25 MED ORDER — DIAZEPAM 5 MG PO TABS
5.0000 mg | ORAL_TABLET | Freq: Once | ORAL | Status: AC
  Administered 2015-01-25: 5 mg via ORAL

## 2015-01-25 MED ORDER — KETOROLAC TROMETHAMINE 30 MG/ML IJ SOLN
30.0000 mg | Freq: Once | INTRAMUSCULAR | Status: AC
  Administered 2015-01-25: 30 mg via INTRAMUSCULAR

## 2015-01-25 MED ORDER — DIPHENHYDRAMINE HCL 50 MG/ML IJ SOLN
25.0000 mg | Freq: Once | INTRAMUSCULAR | Status: AC
  Administered 2015-01-25: 25 mg via INTRAVENOUS
  Filled 2015-01-25: qty 1

## 2015-01-25 MED ORDER — HYDROMORPHONE HCL 1 MG/ML IJ SOLN
1.0000 mg | Freq: Once | INTRAMUSCULAR | Status: AC
  Administered 2015-01-25: 1 mg via INTRAVENOUS
  Filled 2015-01-25: qty 1

## 2015-01-25 MED ORDER — KETOROLAC TROMETHAMINE 30 MG/ML IJ SOLN: 30.0000 mg | Freq: Once | INTRAMUSCULAR | Status: DC

## 2015-01-25 MED ORDER — IOHEXOL 300 MG/ML  SOLN
100.0000 mL | Freq: Once | INTRAMUSCULAR | Status: AC | PRN
  Administered 2015-01-25: 100 mL via INTRAVENOUS

## 2015-01-25 MED ORDER — DIAZEPAM 5 MG PO TABS
ORAL_TABLET | ORAL | Status: AC
  Filled 2015-01-25: qty 1

## 2015-01-25 MED ORDER — DIAZEPAM 5 MG PO TABS: 5.0000 mg | ORAL_TABLET | Freq: Four times a day (QID) | ORAL | Status: DC | PRN

## 2015-01-25 MED ORDER — KETOROLAC TROMETHAMINE 30 MG/ML IJ SOLN
INTRAMUSCULAR | Status: AC
  Filled 2015-01-25: qty 1

## 2015-01-25 MED ORDER — OXYCODONE-ACETAMINOPHEN 5-325 MG PO TABS: 1.0000 | ORAL_TABLET | ORAL | Status: DC | PRN

## 2015-01-25 NOTE — ED Notes (Signed)
Pt ambulating independently w/ steady gait on d/c in no acute distress, A&Ox4. D/c instructions reviewed w/ pt and family - pt and family deny any further questions or concerns at present. Rx given x2  

## 2015-01-25 NOTE — ED Notes (Signed)
Attempted IV x1 unsuccessful - Juliette Mangleerri Doster, RN in to attempt PIV

## 2015-01-25 NOTE — ED Notes (Signed)
Pt states she is having pain in her lower back on the right side that wraps around her hip area and radiates down causing her right knee to hurt  Pt states the knee feels like someone is ripping the inside of her knee apart  Pt states she also has a strong smell to her urine

## 2015-01-25 NOTE — Discharge Instructions (Signed)
Back Pain, Adult Low back pain is very common. About 1 in 5 people have back pain.The cause of low back pain is rarely dangerous. The pain often gets better over time.About half of people with a sudden onset of back pain feel better in just 2 weeks. About 8 in 10 people feel better by 6 weeks.  CAUSES Some common causes of back pain include:  Strain of the muscles or ligaments supporting the spine.  Wear and tear (degeneration) of the spinal discs.  Arthritis.  Direct injury to the back. DIAGNOSIS Most of the time, the direct cause of low back pain is not known.However, back pain can be treated effectively even when the exact cause of the pain is unknown.Answering your caregiver's questions about your overall health and symptoms is one of the most accurate ways to make sure the cause of your pain is not dangerous. If your caregiver needs more information, he or she may order lab work or imaging tests (X-rays or MRIs).However, even if imaging tests show changes in your back, this usually does not require surgery. HOME CARE INSTRUCTIONS For many people, back pain returns.Since low back pain is rarely dangerous, it is often a condition that people can learn to manageon their own.   Remain active. It is stressful on the back to sit or stand in one place. Do not sit, drive, or stand in one place for more than 30 minutes at a time. Take short walks on level surfaces as soon as pain allows.Try to increase the length of time you walk each day.  Do not stay in bed.Resting more than 1 or 2 days can delay your recovery.  Do not avoid exercise or work.Your body is made to move.It is not dangerous to be active, even though your back may hurt.Your back will likely heal faster if you return to being active before your pain is gone.  Pay attention to your body when you bend and lift. Many people have less discomfortwhen lifting if they bend their knees, keep the load close to their bodies,and  avoid twisting. Often, the most comfortable positions are those that put less stress on your recovering back.  Find a comfortable position to sleep. Use a firm mattress and lie on your side with your knees slightly bent. If you lie on your back, put a pillow under your knees.  Only take over-the-counter or prescription medicines as directed by your caregiver. Over-the-counter medicines to reduce pain and inflammation are often the most helpful.Your caregiver may prescribe muscle relaxant drugs.These medicines help dull your pain so you can more quickly return to your normal activities and healthy exercise.  Put ice on the injured area.  Put ice in a plastic bag.  Place a towel between your skin and the bag.  Leave the ice on for 15-20 minutes, 03-04 times a day for the first 2 to 3 days. After that, ice and heat may be alternated to reduce pain and spasms.  Ask your caregiver about trying back exercises and gentle massage. This may be of some benefit.  Avoid feeling anxious or stressed.Stress increases muscle tension and can worsen back pain.It is important to recognize when you are anxious or stressed and learn ways to manage it.Exercise is a great option. SEEK MEDICAL CARE IF:  You have pain that is not relieved with rest or medicine.  You have pain that does not improve in 1 week.  You have new symptoms.  You are generally not feeling well. SEEK   IMMEDIATE MEDICAL CARE IF:   You have pain that radiates from your back into your legs.  You develop new bowel or bladder control problems.  You have unusual weakness or numbness in your arms or legs.  You develop nausea or vomiting.  You develop abdominal pain.  You feel faint. Document Released: 09/01/2005 Document Revised: 03/02/2012 Document Reviewed: 01/03/2014 [employer] Patient Information 2015 [employer]. This information is not intended to replace advice given to you by your health care provider. Make sure you  discuss any questions you have with your health care provider.  

## 2015-01-25 NOTE — ED Notes (Signed)
Pt reports acute onset of rt lower back/hip pain that radiates down her leg - pt denies hx of sciatica.

## 2015-01-25 NOTE — ED Provider Notes (Addendum)
CSN: [id number]     Arrival date & time 01/25/15  0009 History   First MD Initiated Contact with Patient 01/25/15 0120     Chief Complaint  Patient presents with  . Back Pain     (Consider location/radiation/quality/duration/timing/severity/associated sxs/prior Treatment) Patient is a 69 y.o. female presenting with back pain.  Back Pain Location:  Lumbar spine Quality:  Aching and shooting Radiates to: bil le, groin. Pain severity:  Moderate Pain is:  Same all the time Onset quality:  Gradual Duration:  1 day Timing:  Constant Progression:  Unchanged Chronicity:  New Context: not falling, not physical stress and not recent injury   Relieved by:  Nothing Worsened by:  Palpation, touching, twisting and sitting Ineffective treatments:  None tried Associated symptoms: tingling (R leg)   Associated symptoms: no abdominal pain, no bladder incontinence, no bowel incontinence, no dysuria (+ foul smell), no fever, no numbness and no paresthesias     Past Medical History  Diagnosis Date  . Arthritis   . COPD (chronic obstructive pulmonary disease)   . Shingles    Past Surgical History  Procedure Laterality Date  . Abdominal hysterectomy    . Tonsillectomy    . Appendectomy    . Cervical spine surgery     Family History  Problem Relation Age of Onset  . Cancer Father   . Cancer Brother    History  Substance Use Topics  . Smoking status: Never Smoker   . Smokeless tobacco: Not on file  . Alcohol Use: No   OB History    No data available     Review of Systems  Constitutional: Negative for fever.  Gastrointestinal: Negative for abdominal pain and bowel incontinence.  Genitourinary: Negative for bladder incontinence and dysuria (+ foul smell).  Musculoskeletal: Positive for back pain.  Neurological: Positive for tingling (R leg). Negative for numbness and paresthesias.  All other systems reviewed and are negative.     Allergies  Morphine and related;  Promethazine hcl; and Zoloft  Home Medications   Prior to Admission medications   Medication Sig Start Date End Date Taking? Authorizing Provider  albuterol (PROVENTIL HFA;VENTOLIN HFA) 108 (90 BASE) MCG/ACT inhaler Inhale 1-2 puffs into the lungs every 6 (six) hours as needed for wheezing. 11/01/12  Yes Gerhard Munchobert Lockwood, MD  aspirin EC 81 MG tablet Take 81 mg by mouth daily.    Yes Historical Provider, MD  fish oil-omega-3 fatty acids 1000 MG capsule Take 1 g by mouth daily.    Yes Historical Provider, MD  Flaxseed, Linseed, (FLAX SEED OIL PO) Take 2 capsules by mouth daily.    Yes Historical Provider, MD  ibuprofen (ADVIL) 200 MG tablet Take 3 tablets (600 mg total) by mouth every 8 (eight) hours as needed for headache. 06/26/14  Yes Elease EtienneAnand D Hongalgi, MD  Probiotic Product (PROBIOTIC DAILY PO) Take 1 capsule by mouth daily. Phillips probiotic   Yes Historical Provider, MD  diazepam (VALIUM) 5 MG tablet Take 1 tablet (5 mg total) by mouth every 6 (six) hours as needed (spasms). 01/25/15   Mirian MoMatthew Gentry, MD  oxyCODONE-acetaminophen (PERCOCET/ROXICET) 5-325 MG per tablet Take 1-2 tablets by mouth every 4 (four) hours as needed for severe pain. 01/25/15   Mirian MoMatthew Gentry, MD  predniSONE (DELTASONE) 20 MG tablet Take 2 tablets (40 mg total) by mouth daily with breakfast. Patient not taking: Reported on 01/25/2015 01/13/15   Earley FavorGail Schulz, NP   BP 122/70 mmHg  Pulse 69  Temp(Src) 97.6 F (  36.4 C) (Oral)  Resp 18  SpO2 96% Physical Exam  Constitutional: She is oriented to person, place, and time. She appears well-developed and well-nourished.  HENT:  Head: Normocephalic and atraumatic.  Right Ear: External ear normal.  Left Ear: External ear normal.  Eyes: Conjunctivae and EOM are normal. Pupils are equal, round, and reactive to light.  Neck: Normal range of motion. Neck supple.  Cardiovascular: Normal rate, regular rhythm, normal heart sounds and intact distal pulses.   Pulmonary/Chest: Effort  normal and breath sounds normal.  Abdominal: Soft. Bowel sounds are normal. There is tenderness in the right lower quadrant.  Musculoskeletal: Normal range of motion.  R flank tenderness, straight leg positive on R  Neurological: She is alert and oriented to person, place, and time.  Skin: Skin is warm and dry.  Vitals reviewed.   ED Course  Procedures (including critical care time) Labs Review Labs Reviewed  URINALYSIS, ROUTINE W REFLEX MICROSCOPIC - Abnormal; Notable for the following:    Leukocytes, UA SMALL (*)    All other components within normal limits  BASIC METABOLIC PANEL - Abnormal; Notable for the following:    Glucose, Bld 115 (*)    BUN 21 (*)    All other components within normal limits  URINE CULTURE  CBC WITH DIFFERENTIAL/PLATELET  URINE MICROSCOPIC-ADD ON    Imaging Review Ct Abdomen Pelvis W Contrast  01/25/2015   CLINICAL DATA:  Pain in the right lower back, right hip, and right leg for 3 days. No injury. Strong smelling urine. White cells in urine.  EXAM: CT ABDOMEN AND PELVIS WITH CONTRAST  TECHNIQUE: Multidetector CT imaging of the abdomen and pelvis was performed using the standard protocol following bolus administration of intravenous contrast.  CONTRAST:  OMNIPAQUE IOHEXOL 300 MG/ML  SOLN  COMPARISON:  03/08/2009  FINDINGS: Mild dependent changes in the lung bases.  Diffuse fatty infiltration of the liver. No focal liver lesions. The gallbladder, spleen, pancreas, adrenal glands, kidneys, abdominal aorta, inferior vena cava, and retroperitoneal lymph nodes are unremarkable. Stomach, small bowel, and colon are not abnormally distended. Stool fills the colon. No free air or free fluid in the abdomen. Abdominal wall musculature appears intact.  Pelvis: Appendix is not identified. Uterus is surgically absent. Bladder wall is not thickened. No free or loculated pelvic fluid collections. No pelvic mass or lymphadenopathy. Mild degenerative changes the lumbar  spine. No destructive bone lesions.  IMPRESSION: No acute process demonstrated in the abdomen or pelvis. Diffuse fatty infiltration of the liver.   Electronically Signed   By: Burman Nieves M.D.   On: 01/25/2015 04:25     EKG Interpretation   Date/Time:  Thursday Jan 25 2015 02:13:30 EDT Ventricular Rate:  74 PR Interval:  146 QRS Duration: 84 QT Interval:  372 QTC Calculation: 413 R Axis:   76 Text Interpretation:  Sinus rhythm Anterior infarct, old bundle branch  block no longer evident Confirmed by Mirian Mo [phone]) on 01/25/2015  2:42:43 AM     EMERGENCY DEPARTMENT ULTRASOUND  Study: Limited Retroperitoneal Ultrasound of the Abdominal Aorta.  INDICATIONS:Back pain Multiple views of the abdominal aorta were obtained in real-time from the diaphragmatic hiatus to the aortic bifurcation in transverse planes with a multi-frequency probe. PERFORMED BY: Myself IMAGES ARCHIVED?: Yes FINDINGS: Free fluid absent, maximal aortic dimensions 1.96 cm LIMITATIONS:  Bowel gas INTERPRETATION:  No abdominal aortic aneurysm and Abdominal free fluid absent   CPT Code: 60454-09 (limited retroperitoneal)  Emergency Focused Ultrasound Exam Limited retroperitoneal  ultrasound of kidneys  Performed and interpreted by Dr. Littie DeedsGentry Indication: flank pain Focused abdominal ultrasound with both kidneys imaged in transverse and longitudinal planes in real-time. Interpretation: no hydronephrosis visualized.   Images archived electronically  CPT Code: [id number] (limited retroperitoneal)  MDM   Final diagnoses:  Back pain  Sciatica, unspecified laterality    69 y.o. female with pertinent PMH of shingles, copd, prior appendectomy presents with back pain as above, atraumatic.  No fevers, infectious symptoms.  No chest pain or dyspnea.  No gi symptoms.  Pain worse with movement and palpation, however not strictly spinal or paraspinal.  Exam as above.  Obtained CT scan as valium did not relieve  pain and could not reproduce with paraspinal pressure.  No cauda equina history or exam elements.  CT scan unremarkable.  Likely sciatica.  DC home to fu with spine and PCP.    I have reviewed all laboratory and imaging studies if ordered as above  1. Sciatica, unspecified laterality   2. Back pain      Mirian MoMatthew Gentry, MD 01/25/15 [id number]   Mirian MoMatthew Gentry, MD 01/25/15 [phone]

## 2015-01-26 LAB — URINE CULTURE: Colony Count: 3000

## 2015-04-05 ENCOUNTER — Emergency Department (HOSPITAL_COMMUNITY): Payer: Medicare Other

## 2015-04-05 ENCOUNTER — Emergency Department (HOSPITAL_COMMUNITY)
Admission: EM | Admit: 2015-04-05 | Discharge: 2015-04-06 | Disposition: A | Discharge status: Home / Self Care | Payer: Medicare Other | Attending: Emergency Medicine | Admitting: Emergency Medicine

## 2015-04-05 ENCOUNTER — Encounter (HOSPITAL_COMMUNITY): Payer: Self-pay | Admitting: Emergency Medicine

## 2015-04-05 DIAGNOSIS — E860 Dehydration: Secondary | ICD-10-CM | POA: Diagnosis not present

## 2015-04-05 DIAGNOSIS — Z79899 Other long term (current) drug therapy: Secondary | ICD-10-CM | POA: Insufficient documentation

## 2015-04-05 DIAGNOSIS — R42 Dizziness and giddiness: Secondary | ICD-10-CM | POA: Insufficient documentation

## 2015-04-05 DIAGNOSIS — R110 Nausea: Secondary | ICD-10-CM | POA: Diagnosis not present

## 2015-04-05 DIAGNOSIS — Z7982 Long term (current) use of aspirin: Secondary | ICD-10-CM | POA: Diagnosis not present

## 2015-04-05 DIAGNOSIS — Z8619 Personal history of other infectious and parasitic diseases: Secondary | ICD-10-CM | POA: Diagnosis not present

## 2015-04-05 DIAGNOSIS — R0602 Shortness of breath: Secondary | ICD-10-CM

## 2015-04-05 DIAGNOSIS — R531 Weakness: Secondary | ICD-10-CM | POA: Diagnosis present

## 2015-04-05 DIAGNOSIS — M1990 Unspecified osteoarthritis, unspecified site: Secondary | ICD-10-CM | POA: Insufficient documentation

## 2015-04-05 DIAGNOSIS — J441 Chronic obstructive pulmonary disease with (acute) exacerbation: Secondary | ICD-10-CM | POA: Diagnosis not present

## 2015-04-05 LAB — COMPREHENSIVE METABOLIC PANEL
ALBUMIN: 4 g/dL (ref 3.5–5.0)
ALT: 25 U/L (ref 14–54)
ANION GAP: 8 (ref 5–15)
AST: 24 U/L (ref 15–41)
Alkaline Phosphatase: 73 U/L (ref 38–126)
BUN: 11 mg/dL (ref 6–20)
CO2: 23 mmol/L (ref 22–32)
CREATININE: 0.72 mg/dL (ref 0.44–1.00)
Calcium: 8.7 mg/dL — ABNORMAL LOW (ref 8.9–10.3)
Chloride: 108 mmol/L (ref 101–111)
GFR calc Af Amer: >60 mL/min (ref >60)
GFR calc non Af Amer: >60 mL/min (ref >60)
GLUCOSE: 134 mg/dL — AB (ref 65–99)
Potassium: 3.7 mmol/L (ref 3.5–5.1)
Sodium: 139 mmol/L (ref 135–145)
TOTAL PROTEIN: 6.9 g/dL (ref 6.5–8.1)
Total Bilirubin: 0.4 mg/dL (ref 0.3–1.2)

## 2015-04-05 LAB — CBC WITH DIFFERENTIAL/PLATELET
BASOS PCT: 0 % (ref 0–1)
Basophils Absolute: 0 10*3/uL (ref 0.0–0.1)
Eosinophils Absolute: 0.1 10*3/uL (ref 0.0–0.7)
Eosinophils Relative: 1 % (ref 0–5)
HEMATOCRIT: 34.7 % — AB (ref 36.0–46.0)
Hemoglobin: 12 g/dL (ref 12.0–15.0)
LYMPHS ABS: 1.1 10*3/uL (ref 0.7–4.0)
LYMPHS PCT: 14 % (ref 12–46)
MCH: 30.7 pg (ref 26.0–34.0)
MCHC: 34.6 g/dL (ref 30.0–36.0)
MCV: 88.7 fL (ref 78.0–100.0)
MONO ABS: 0.6 10*3/uL (ref 0.1–1.0)
Monocytes Relative: 7 % (ref 3–12)
NEUTROS PCT: 78 % — AB (ref 43–77)
Neutro Abs: 6.2 10*3/uL (ref 1.7–7.7)
PLATELETS: 164 10*3/uL (ref 150–400)
RBC: 3.91 MIL/uL (ref 3.87–5.11)
RDW: 13.7 % (ref 11.5–15.5)
WBC: 8 10*3/uL (ref 4.0–10.5)

## 2015-04-05 LAB — BRAIN NATRIURETIC PEPTIDE: B Natriuretic Peptide: 28.2 pg/mL (ref 0.0–100.0)

## 2015-04-05 LAB — TROPONIN I

## 2015-04-05 MED ORDER — LORAZEPAM 2 MG/ML IJ SOLN
0.5000 mg | Freq: Once | INTRAMUSCULAR | Status: AC
  Administered 2015-04-05: 0.5 mg via INTRAVENOUS
  Filled 2015-04-05: qty 1

## 2015-04-05 MED ORDER — ONDANSETRON HCL 4 MG/2ML IJ SOLN
4.0000 mg | Freq: Once | INTRAMUSCULAR | Status: AC
  Administered 2015-04-05: 4 mg via INTRAVENOUS
  Filled 2015-04-05: qty 2

## 2015-04-05 MED ORDER — ACETAMINOPHEN 325 MG PO TABS
650.0000 mg | ORAL_TABLET | Freq: Once | ORAL | Status: AC
  Administered 2015-04-06: 650 mg via ORAL
  Filled 2015-04-05: qty 2

## 2015-04-05 MED ORDER — SODIUM CHLORIDE 0.9 % IV BOLUS (SEPSIS)
1000.0000 mL | Freq: Once | INTRAVENOUS | Status: AC
  Administered 2015-04-05: 1000 mL via INTRAVENOUS

## 2015-04-05 NOTE — ED Notes (Signed)
Pt ambulated with ease, minimal assistance needed, oxygen saturation maintained aver 96 %

## 2015-04-05 NOTE — ED Provider Notes (Signed)
CSN: [id number]     Arrival date & time 04/05/15  1821 History   First MD Initiated Contact with Patient 04/05/15 1842     Chief Complaint  Patient presents with  . Weakness     (Consider location/radiation/quality/duration/timing/severity/associated sxs/prior Treatment) HPI [name] is a 69 year old female with past medical history of COPD who presents the ER complaining of lightheadedness, nausea, mild dyspnea on exertion. Patient reports on Monday noticing mild lightheadedness after mowing her lawn. Patient states she feels as though she may have been overheated. Patient reports gradual worsening of symptoms throughout the week with nausea, mild dyspnea on exertion. Patient states her symptoms were acutely worsened this afternoon around 5 PM. Patient reports standing up, noticing becoming lightheaded, and noticing some mild dyspnea on exertion. Patient also noted bilateral leg weakness. Patient states her symptoms subsided when sitting back down. Patient denies chest pain, palpitations, blurred vision, headache, fever, vomiting, abdominal pain, dysuria, diarrhea. Patient denies recent travel, leg swelling, estrogen therapy, history of PE/DVT.  Past Medical History  Diagnosis Date  . Arthritis   . COPD (chronic obstructive pulmonary disease)   . Shingles    Past Surgical History  Procedure Laterality Date  . Abdominal hysterectomy    . Tonsillectomy    . Appendectomy    . Cervical spine surgery     Family History  Problem Relation Age of Onset  . Cancer Father   . Cancer Brother    History  Substance Use Topics  . Smoking status: Never Smoker   . Smokeless tobacco: Not on file  . Alcohol Use: No   OB History    No data available     Review of Systems  Constitutional: Negative for fever.  HENT: Negative for trouble swallowing.   Eyes: Negative for visual disturbance.  Respiratory: Positive for shortness of breath.   Cardiovascular: Negative for chest pain.   Gastrointestinal: Positive for nausea. Negative for vomiting and abdominal pain.  Genitourinary: Negative for dysuria.  Musculoskeletal: Negative for neck pain.  Skin: Negative for rash.  Neurological: Positive for light-headedness. Negative for dizziness, weakness and numbness.  Psychiatric/Behavioral: Negative.     Allergies  Morphine and related; Promethazine hcl; and Zoloft  Home Medications   Prior to Admission medications   Medication Sig Start Date End Date Taking? Authorizing Provider  albuterol (PROVENTIL HFA;VENTOLIN HFA) 108 (90 BASE) MCG/ACT inhaler Inhale 1-2 puffs into the lungs every 6 (six) hours as needed for wheezing. 11/01/12  Yes Gerhard Munch, MD  aspirin EC 81 MG tablet Take 81 mg by mouth daily.    Yes Historical Provider, MD  fish oil-omega-3 fatty acids 1000 MG capsule Take 1 g by mouth daily.    Yes Historical Provider, MD  Flaxseed, Linseed, (FLAX SEED OIL PO) Take 2 capsules by mouth daily.    Yes Historical Provider, MD  ibuprofen (ADVIL) 200 MG tablet Take 3 tablets (600 mg total) by mouth every 8 (eight) hours as needed for headache. 06/26/14  Yes Elease Etienne, MD   BP 133/61 mmHg  Pulse 77  Temp(Src) 97.9 F (36.6 C) (Oral)  Resp 16  Ht 5\' 3"  (1.6 m)  Wt 158 lb (71.668 kg)  BMI 28.00 kg/m2  SpO2 95% Physical Exam  Constitutional: She is oriented to person, place, and time. She appears well-developed and well-nourished. No distress.  HENT:  Head: Normocephalic and atraumatic.  Mouth/Throat: Oropharynx is clear and moist. No oropharyngeal exudate.  Eyes: Right eye exhibits no discharge. Left eye exhibits  no discharge. No scleral icterus.  Neck: Normal range of motion.  Cardiovascular: Normal rate, regular rhythm and normal heart sounds.   No murmur heard. Pulmonary/Chest: Effort normal and breath sounds normal. No respiratory distress.  Abdominal: Soft. There is no tenderness.  Musculoskeletal: Normal range of motion. She exhibits no edema  or tenderness.  Neurological: She is alert and oriented to person, place, and time. She has normal strength. No cranial nerve deficit or sensory deficit. Coordination normal. GCS eye subscore is 4. GCS verbal subscore is 5. GCS motor subscore is 6.  Patient fully alert, answering questions appropriately in full, clear sentences. Cranial nerves II through XII grossly intact. Motor strength 5 out of 5 in all major muscle groups of upper and lower extremities. Distal sensation intact.   Skin: Skin is warm and dry. No rash noted. She is not diaphoretic.  Psychiatric: She has a normal mood and affect.  Nursing note and vitals reviewed.   ED Course  Procedures (including critical care time) Labs Review Labs Reviewed  CBC WITH DIFFERENTIAL/PLATELET - Abnormal; Notable for the following:    HCT 34.7 (*)    Neutrophils Relative % 78 (*)    All other components within normal limits  COMPREHENSIVE METABOLIC PANEL - Abnormal; Notable for the following:    Glucose, Bld 134 (*)    Calcium 8.7 (*)    All other components within normal limits  TROPONIN I  BRAIN NATRIURETIC PEPTIDE    Imaging Review Dg Chest 2 View  04/05/2015   CLINICAL DATA:  Shortness of breath for 2 hours. Hypertension dizziness. History of COPD.  EXAM: CHEST  2 VIEW  COMPARISON:  01/12/2015; 10/26/2013; 10/22/2012  FINDINGS: Grossly unchanged cardiac silhouette and mediastinal contours. There is persistent mild elevation / eventration of the right hemidiaphragm with associated right infrahilar opacities likely atelectasis. No new focal airspace opacities. No pleural effusion or pneumothorax. No evidence of edema. No acute osseous abnormalities. Post lower cervical ACDF.  IMPRESSION: No acute cardiopulmonary disease.   Electronically Signed   By: Simonne Come M.D.   On: 04/05/2015 19:44     EKG Interpretation   Date/Time:  Thursday April 05 2015 18:31:35 EDT Ventricular Rate:  88 PR Interval:  148 QRS Duration: 80 QT Interval:   355 QTC Calculation: 429 R Axis:   73 Text Interpretation:  Sinus rhythm Confirmed by Rubin Payor  MD, Harrold Donath  [phone]) on 04/05/2015 7:57:04 PM      MDM   Final diagnoses:  SOB (shortness of breath)  Mild dehydration    Patient's signs and symptoms consistent with mild dehydration. Lab work unremarkable for acute pathology. Fluid replaced here, patient able to ambulate well , stating she is back to baseline, asymptomatic. O2 saturation maintaining over 96% while ambulating on room air. EKG unremarkable for acute injury or ectopy. Troponin negative/0 greater than 6 hours after onset of symptoms. No concern for ACS. Chest x-ray does not show evidence of acute pathology. Patient is low risk for PE without risk factors. Patient afebrile, hemodynamically stable and in no acute distress, asymptomatic. Patient stable for discharge. Patient encouraged to follow up with PCP, states she has appointment tomorrow morning. Return precautions discussed, patient verbalizes understanding and agreement with this plan.    BP 133/61 mmHg  Pulse 77  Temp(Src) 97.9 F (36.6 C) (Oral)  Resp 16  Ht 5\' 3"  (1.6 m)  Wt 158 lb (71.668 kg)  BMI 28.00 kg/m2  SpO2 95%  Signed,  Ladona Mow, PA-C 12:28 AM  Patient discussed with Dr. Benjiman Core, MD  Ladona Mow, PA-C 04/06/15 0028  Benjiman Core, MD 04/06/15 [phone]

## 2015-04-05 NOTE — ED Notes (Signed)
Pt states on Monday she push mowed her yard and felt like she got hot and the next day she started feeling dizzy nauseous and weak all over.Pt states she felt a little better and then approximately 5pm today pt felt weak and dizzy again upon standing up to go to the kitchen. Pt is SOB denies any chest pain.

## 2015-04-05 NOTE — ED Notes (Signed)
Phlebotomy aware of need for blood draw 

## 2015-04-06 NOTE — Discharge Instructions (Signed)
Follow-up with your primary care doctor at your scheduled appointment tomorrow. Return to the ER if he develop any severe shortness of breath, chest pain that radiates to your arm or neck, dizziness, severe weakness, or feeling like you're going to pass out.  Dehydration, Adult Dehydration is when you lose more fluids from the body than you take in. Vital organs like the kidneys, brain, and heart cannot function without a proper amount of fluids and salt. Any loss of fluids from the body can cause dehydration.  CAUSES   Vomiting.  Diarrhea.  Excessive sweating.  Excessive urine output.  Fever. SYMPTOMS  Mild dehydration  Thirst.  Dry lips.  Slightly dry mouth. Moderate dehydration  Very dry mouth.  Sunken eyes.  Skin does not bounce back quickly when lightly pinched and released.  Dark urine and decreased urine production.  Decreased tear production.  Headache. Severe dehydration  Very dry mouth.  Extreme thirst.  Rapid, weak pulse (more than 100 beats per minute at rest).  Cold hands and feet.  Not able to sweat in spite of heat and temperature.  Rapid breathing.  Blue lips.  Confusion and lethargy.  Difficulty being awakened.  Minimal urine production.  No tears. DIAGNOSIS  Your caregiver will diagnose dehydration based on your symptoms and your exam. Blood and urine tests will help confirm the diagnosis. The diagnostic evaluation should also identify the cause of dehydration. TREATMENT  Treatment of mild or moderate dehydration can often be done at home by increasing the amount of fluids that you drink. It is best to drink small amounts of fluid more often. Drinking too much at one time can make vomiting worse. Refer to the home care instructions below. Severe dehydration needs to be treated at the hospital where you will probably be given intravenous (IV) fluids that contain water and electrolytes. HOME CARE INSTRUCTIONS   Ask your caregiver about  specific rehydration instructions.  Drink enough fluids to keep your urine clear or pale yellow.  Drink small amounts frequently if you have nausea and vomiting.  Eat as you normally do.  Avoid:  Foods or drinks high in sugar.  Carbonated drinks.  Juice.  Extremely hot or cold fluids.  Drinks with caffeine.  Fatty, greasy foods.  Alcohol.  Tobacco.  Overeating.  Gelatin desserts.  Wash your hands well to avoid spreading bacteria and viruses.  Only take over-the-counter or prescription medicines for pain, discomfort, or fever as directed by your caregiver.  Ask your caregiver if you should continue all prescribed and over-the-counter medicines.  Keep all follow-up appointments with your caregiver. SEEK MEDICAL CARE IF:  You have abdominal pain and it increases or stays in one area (localizes).  You have a rash, stiff neck, or severe headache.  You are irritable, sleepy, or difficult to awaken.  You are weak, dizzy, or extremely thirsty. SEEK IMMEDIATE MEDICAL CARE IF:   You are unable to keep fluids down or you get worse despite treatment.  You have frequent episodes of vomiting or diarrhea.  You have blood or green matter (bile) in your vomit.  You have blood in your stool or your stool looks black and tarry.  You have not urinated in 6 to 8 hours, or you have only urinated a small amount of very dark urine.  You have a fever.  You faint. MAKE SURE YOU:   Understand these instructions.  Will watch your condition.  Will get help right away if you are not doing well or get  worse. Document Released: 09/01/2005 Document Revised: 11/24/2011 Document Reviewed: 04/21/2011 [facility] Patient Information 2015 [place], [place]. This information is not intended to replace advice given to you by your health care provider. Make sure you discuss any questions you have with your health care provider.

## 2018-08-21 ENCOUNTER — Other Ambulatory Visit: Payer: Self-pay

## 2018-08-21 ENCOUNTER — Emergency Department (HOSPITAL_BASED_OUTPATIENT_CLINIC_OR_DEPARTMENT_OTHER): Payer: Medicare HMO

## 2018-08-21 ENCOUNTER — Encounter (HOSPITAL_BASED_OUTPATIENT_CLINIC_OR_DEPARTMENT_OTHER): Payer: Self-pay | Admitting: Emergency Medicine

## 2018-08-21 ENCOUNTER — Emergency Department (HOSPITAL_BASED_OUTPATIENT_CLINIC_OR_DEPARTMENT_OTHER)
Admission: EM | Admit: 2018-08-21 | Discharge: 2018-08-21 | Disposition: A | Discharge status: Home / Self Care | Payer: Medicare HMO | Attending: Emergency Medicine | Admitting: Emergency Medicine

## 2018-08-21 DIAGNOSIS — Z7982 Long term (current) use of aspirin: Secondary | ICD-10-CM | POA: Insufficient documentation

## 2018-08-21 DIAGNOSIS — R51 Headache: Secondary | ICD-10-CM | POA: Insufficient documentation

## 2018-08-21 DIAGNOSIS — R202 Paresthesia of skin: Secondary | ICD-10-CM | POA: Insufficient documentation

## 2018-08-21 DIAGNOSIS — R42 Dizziness and giddiness: Secondary | ICD-10-CM

## 2018-08-21 DIAGNOSIS — R531 Weakness: Secondary | ICD-10-CM | POA: Diagnosis not present

## 2018-08-21 DIAGNOSIS — M542 Cervicalgia: Secondary | ICD-10-CM | POA: Diagnosis present

## 2018-08-21 DIAGNOSIS — Z8673 Personal history of transient ischemic attack (TIA), and cerebral infarction without residual deficits: Secondary | ICD-10-CM | POA: Insufficient documentation

## 2018-08-21 DIAGNOSIS — Z79899 Other long term (current) drug therapy: Secondary | ICD-10-CM | POA: Insufficient documentation

## 2018-08-21 DIAGNOSIS — R29898 Other symptoms and signs involving the musculoskeletal system: Secondary | ICD-10-CM

## 2018-08-21 DIAGNOSIS — R519 Headache, unspecified: Secondary | ICD-10-CM

## 2018-08-21 DIAGNOSIS — J449 Chronic obstructive pulmonary disease, unspecified: Secondary | ICD-10-CM | POA: Diagnosis not present

## 2018-08-21 LAB — CBC WITH DIFFERENTIAL/PLATELET
Abs Immature Granulocytes: 0.12 10*3/uL — ABNORMAL HIGH (ref 0.00–0.07)
BASOS PCT: 1 %
Basophils Absolute: 0 10*3/uL (ref 0.0–0.1)
EOS PCT: 2 %
Eosinophils Absolute: 0.1 10*3/uL (ref 0.0–0.5)
HCT: 33.9 % — ABNORMAL LOW (ref 36.0–46.0)
Hemoglobin: 11.3 g/dL — ABNORMAL LOW (ref 12.0–15.0)
IMMATURE GRANULOCYTES: 2 %
Lymphocytes Relative: 18 %
Lymphs Abs: 1.1 10*3/uL (ref 0.7–4.0)
MCH: 31.6 pg (ref 26.0–34.0)
MCHC: 33.3 g/dL (ref 30.0–36.0)
MCV: 94.7 fL (ref 80.0–100.0)
Monocytes Absolute: 0.5 10*3/uL (ref 0.1–1.0)
Monocytes Relative: 8 %
NEUTROS PCT: 69 %
NRBC: 0 % (ref 0.0–0.2)
Neutro Abs: 4.2 10*3/uL (ref 1.7–7.7)
PLATELETS: 191 10*3/uL (ref 150–400)
RBC: 3.58 MIL/uL — ABNORMAL LOW (ref 3.87–5.11)
RDW: 14.6 % (ref 11.5–15.5)
WBC: 6.1 10*3/uL (ref 4.0–10.5)

## 2018-08-21 LAB — TROPONIN I: Troponin I: <0.03 ng/mL (ref <0.03)

## 2018-08-21 LAB — BASIC METABOLIC PANEL
ANION GAP: 8 (ref 5–15)
BUN: 11 mg/dL (ref 8–23)
CALCIUM: 9.2 mg/dL (ref 8.9–10.3)
CO2: 25 mmol/L (ref 22–32)
CREATININE: 0.73 mg/dL (ref 0.44–1.00)
Chloride: 107 mmol/L (ref 98–111)
Glucose, Bld: 104 mg/dL — ABNORMAL HIGH (ref 70–99)
Potassium: 4 mmol/L (ref 3.5–5.1)
Sodium: 140 mmol/L (ref 135–145)

## 2018-08-21 LAB — MAGNESIUM: Magnesium: 2.2 mg/dL (ref 1.7–2.4)

## 2018-08-21 MED ORDER — IBUPROFEN 400 MG PO TABS
600.0000 mg | ORAL_TABLET | Freq: Once | ORAL | Status: AC
  Administered 2018-08-21: 600 mg via ORAL
  Filled 2018-08-21: qty 1

## 2018-08-21 MED ORDER — ACETAMINOPHEN 500 MG PO TABS
1000.0000 mg | ORAL_TABLET | Freq: Once | ORAL | Status: AC
  Administered 2018-08-21: 1000 mg via ORAL
  Filled 2018-08-21: qty 2

## 2018-08-21 MED ORDER — LORAZEPAM 2 MG/ML IJ SOLN
1.0000 mg | Freq: Once | INTRAMUSCULAR | Status: AC
  Administered 2018-08-21: 1 mg via INTRAVENOUS
  Filled 2018-08-21: qty 1

## 2018-08-21 MED ORDER — ORPHENADRINE CITRATE ER 100 MG PO TB12: 100.0000 mg | ORAL_TABLET | Freq: Two times a day (BID) | ORAL | 0 refills | Status: DC

## 2018-08-21 MED ORDER — HYDROCODONE-ACETAMINOPHEN 5-325 MG PO TABS: 1.0000 | ORAL_TABLET | Freq: Four times a day (QID) | ORAL | 0 refills | Status: DC | PRN

## 2018-08-21 MED ORDER — HYDROMORPHONE HCL 1 MG/ML IJ SOLN
0.5000 mg | Freq: Once | INTRAMUSCULAR | Status: AC
  Administered 2018-08-21: 0.5 mg via INTRAVENOUS
  Filled 2018-08-21: qty 1

## 2018-08-21 MED ORDER — GADOBENATE DIMEGLUMINE 529 MG/ML IV SOLN
10.0000 mL | Freq: Once | INTRAVENOUS | Status: AC | PRN
  Administered 2018-08-21: 10 mL via INTRAVENOUS

## 2018-08-21 NOTE — Discharge Instructions (Addendum)
1.  You may take hydrocodone and Norflex as prescribed for headache and neck pain. 2.  You are to be seen by a neurologist as soon as possible.  Follow-up with your family doctor at the beginning of the week to discuss referral to neurology.  You will also need to get a test called an EMG.  This is done as an outpatient.  It is described in your discharge instructions. 3.  Follow-up with the neurologist listed in your discharge instructions. 4.  Return to the emergency department if you have worsening or changing symptoms.

## 2018-08-21 NOTE — ED Notes (Signed)
Deniece PortelaWayne- [phone], Patient Son

## 2018-08-21 NOTE — Consult Note (Signed)
   [facility] Consult [facility]  Date of Service: 08/21/2018  Impression:  1. Considerations include cervical radiculopathy including central or multilevel foraminal stenosis 2. Ischemic stroke left hemisphere although the findings of pain would certainly be a typical 3. Cervical carotid or vertebral dissection which we unlikely with normal CTA two days ago.  Recommendations: I discussed directly with the emergency department physician that she should have MRI of the head, cervical spine, and MRA of the head and neck. If these get done today is welcome to contact me again for review and advice. Patient is willing to be admitted if necessary.  ---------------------------------------------------------------------  CC: neck pain and right-sided weakness  History of Present Illness: this is a 72 year old woman who presented to the hospital two days ago with an episode of this. Imaging studies including CT of the head and CTA were negative. She was discharged. The symptoms resolved. She woke this morning at 0100 with throbbing pain in the posterior neck region on the right pain radiating to the right arm. She has weakness and numbness of the right arm and leg. It hurts to turn her head. The neck is tender. She has not had a neck injury. She is healthy woman and has no history of hypertension, diabetes, coronary disease, atrial fibrillation, hyperlipidemia. She has a left bundle branch block. She is a non-smoker. She is not on any antiplatelet or anticoagulant medication.   Diagnostic Testing: as above. No other imaging studies have yet been done today.  Exam:  Mental Status:  Awake, alert, oriented  Speech: fluent  Cranial Nerves: Extraocular movements: Intact in all cardinal gaze Ptosis: Absent Facial sensation: diminished on the right compared to the left Facial movements: Intact and symmetric    Motor Exam:  No drift upper extremities, however, she has mild weakness on  the right side compared to the left of the grip, deltoid, and biceps. No drift lower extremities, however, she has mild weakness on right side compared to the left of ankle dorsiflexion, hip flexors and hip extensors.   Tremor/Abnormal Movements:  Resting tremor: Absent Intention tremor: Absent Postural tremor: Absent  Sensation: Pinprick: diminished on the right arm and leg diffusely compared to the left.  Coordination:   Finger to nose: Intact  Medical Decision Making:  - Extensive number of diagnosis or management options are considered above.   - Extensive amount of complex data reviewed.   - High risk of complication and/or morbidity or mortality are associated with differential diagnostic considerations above.  - There may be uncertain outcome and increased probability of prolonged functional impairment or high probability of severe prolonged functional impairment associated with some of these differential diagnosis.   Medical Data Reviewed:  1.Data reviewed include clinical labs, radiology,  Medical Tests;   2.Tests results discussed w/performing or interpreting physician;   3.Obtaining/reviewing old medical records;  4.Obtaining case history from another source;  5.Independent review of image, tracing or specimen.    Patient was informed the Neurology Consult would happen via TeleHealth consult by way of interactive audio and video telecommunications and consented to receiving care in this manner.

## 2018-08-21 NOTE — ED Notes (Signed)
Pt remains in MRI 

## 2018-08-21 NOTE — ED Notes (Signed)
ED Provider at bedside. 

## 2018-08-21 NOTE — ED Provider Notes (Signed)
[facility] EMERGENCY DEPARTMENT Provider Note   CSN: [id number] Arrival date & time: 08/21/18  7829     History   Chief Complaint Chief Complaint  Patient presents with  . Numbness  . Headache    HPI [name] is a 72 y.o. female.  She is presenting here today with complaint of right-sided neck pain and right arm and leg numbness and some weakness.  She said Thursday night she acutely had dizziness room spinning and she went to [facility] where she had blood work and a CAT scan of her head and an angiogram of her head.  Ultimately they told her this was likely vertigo and sent her home with some meclizine.  She says her dizziness has improved and is not there now.  She woke up around 1 AM this morning with pain in the right side of her neck along with some tingling in her right arm.  She says she feels her pulse in her neck and behind her right ear along with a mild frontal headache.  No dizziness no lightheadedness no blurry vision no double vision no difficulty swallowing no chest pain or shortness of breath.  She has had no trouble ambulating.  The history is provided by the patient.  Cerebrovascular Accident  This is a new problem. The current episode started 6 to 12 hours ago. The problem occurs constantly. The problem has not changed since onset.Associated symptoms include headaches. Pertinent negatives include no chest pain, no abdominal pain and no shortness of breath. Nothing aggravates the symptoms. Nothing relieves the symptoms. She has tried nothing for the symptoms. The treatment provided no relief.    Past Medical History:  Diagnosis Date  . Arthritis   . COPD (chronic obstructive pulmonary disease) (HCC)   . Shingles     Patient Active Problem List   Diagnosis Date Noted  . Stroke [facility]) 06/25/2014    Past Surgical History:  Procedure Laterality Date  . ABDOMINAL HYSTERECTOMY    . APPENDECTOMY    . CERVICAL SPINE SURGERY    .  TONSILLECTOMY       OB History   None      Home Medications    Prior to Admission medications   Medication Sig Start Date End Date Taking? Authorizing Provider  albuterol (PROVENTIL HFA;VENTOLIN HFA) 108 (90 BASE) MCG/ACT inhaler Inhale 1-2 puffs into the lungs every 6 (six) hours as needed for wheezing. 11/01/12   Gerhard Munch, MD  aspirin EC 81 MG tablet Take 81 mg by mouth daily.     [provider]  fish oil-omega-3 fatty acids 1000 MG capsule Take 1 g by mouth daily.     [provider]  Flaxseed, Linseed, (FLAX SEED OIL PO) Take 2 capsules by mouth daily.     [provider]  ibuprofen (ADVIL) 200 MG tablet Take 3 tablets (600 mg total) by mouth every 8 (eight) hours as needed for headache. 06/26/14   Elease Etienne, MD    Family History Family History  Problem Relation Age of Onset  . Cancer Father   . Cancer Brother     Social History Social History   Tobacco Use  . Smoking status: Never Smoker  Substance Use Topics  . Alcohol use: No  . Drug use: No     Allergies   Morphine and related; Promethazine hcl; and Zoloft [sertraline hcl]   Review of Systems Review of Systems  Constitutional: Negative for fever.  HENT:  Positive for ear pain. Negative for sore throat.   Eyes: Negative for pain and visual disturbance.  Respiratory: Negative for shortness of breath.   Cardiovascular: Negative for chest pain.  Gastrointestinal: Positive for nausea. Negative for abdominal pain and vomiting.  Genitourinary: Negative for dysuria.  Musculoskeletal: Positive for neck pain. Negative for back pain.  Skin: Negative for rash.  Neurological: Positive for weakness, numbness and headaches. Negative for syncope.     Physical Exam Updated Vital Signs BP (!) 153/88   Pulse 83   Temp 98.2 F (36.8 C)   Resp 20   Ht 5\' 3"  (1.6 m)   Wt 65.8 kg   SpO2 96%   BMI 25.69 kg/m   Physical Exam  Constitutional: She is oriented to person,  place, and time. She appears well-developed and well-nourished. No distress.  HENT:  Head: Normocephalic and atraumatic.  Eyes: Conjunctivae are normal.  Neck: Neck supple.  Cardiovascular: Normal rate and regular rhythm.  No murmur heard. Pulmonary/Chest: Effort normal and breath sounds normal. No respiratory distress.  Abdominal: Soft. There is no tenderness.  Musculoskeletal: She exhibits no edema.  Neurological: She is alert and oriented to person, place, and time. She has normal strength. A sensory deficit is present. No cranial nerve deficit. Coordination and gait normal. GCS eye subscore is 4. GCS verbal subscore is 5. GCS motor subscore is 6.  She has subjective decreased sensation on the right side of her neck right entire arm right trunk and right leg.  She subjectively feels weaker on the right side although I do not appreciate this on my exam.  Skin: Skin is warm and dry.  Psychiatric: She has a normal mood and affect.  Nursing note and vitals reviewed.    ED Treatments / Results  Labs (all labs ordered are listed, but only abnormal results are displayed) Labs Reviewed  BASIC METABOLIC PANEL - Abnormal; Notable for the following components:      Result Value   Glucose, Bld 104 (*)    All other components within normal limits  CBC WITH DIFFERENTIAL/PLATELET - Abnormal; Notable for the following components:   RBC 3.58 (*)    Hemoglobin 11.3 (*)    HCT 33.9 (*)    Abs Immature Granulocytes 0.12 (*)    All other components within normal limits  TROPONIN I  MAGNESIUM    EKG EKG Interpretation  Date/Time:  Saturday August 21 2018 07:12:18 EST Ventricular Rate:  81 PR Interval:    QRS Duration: 133 QT Interval:  396 QTC Calculation: 460 R Axis:   70 Text Interpretation:  Sinus rhythm Left bundle branch block new LBBB since prior 7/16 Confirmed by Meridee Score [phone]) on 08/21/2018 7:17:55 AM   Radiology No results found.  Procedures Procedures (including  critical care time)  Medications Ordered in ED Medications  LORazepam (ATIVAN) injection 1 mg (1 mg Intravenous Given 08/21/18 1456)  ibuprofen (ADVIL,MOTRIN) tablet 600 mg (600 mg Oral Given 08/21/18 0857)  gadobenate dimeglumine (MULTIHANCE) injection 10 mL (10 mLs Intravenous Contrast Given 08/21/18 1650)     Initial Impression / Assessment and Plan / ED Course  I have reviewed the triage vital signs and the nursing notes.  Pertinent labs & imaging results that were available during my care of the patient were reviewed by me and considered in my medical decision making (see chart for details).  Clinical Course as of Aug 21 1705  Sat Aug 21, 2018  4071 73 year old female here with neck pain and  numbness.  She recently was at [facility] for what sounds like vertigo and had a negative CT head and CTA head and neck.  Here now with right-sided neck pain along with subjective sensation of decreased strength in her right arm along with numbness in her right arm and leg.  I placed a call into neurology to review my findings and get some recommendations.   [MB]  0719 ECG is left bundle branch block normal intervals otherwise no acute ST-T changes.  This is different from 2016 EKG I have in the system although on the verbal report of her [facility] 2 days ago she was left bundle branch block morphology.   [MB]  [phone] Patient told me she is never had anything like this before but on review of her prior medical records she is definitely had some complaints of pounding in her ears neck pain and she had an admission a few years ago for acute onset of left leg weakness that eventually was attributed to a complex migraine after a negative CT and MRI.   [MB]  [id number] Discussed with Dr. Ezzard StandingNewman from neurology who has evaluated the patient via tele-neurology.  He recommends getting a MRI of brain and MRI of C-spine along with MRA of brain and neck.  He identified that the patient had some  weakness in the right arm and leg along with numbness of right arm and leg.   [MB]  [id number] Recommendations per Dr Ezzard StandingNewman - [employer]  Date of Service: 08/21/2018  Impression:  1. Considerations include cervical radiculopathy including central or multilevel foraminal stenosis 2. Ischemic stroke left hemisphere although the findings of pain would certainly be a typical 3. Cervical carotid or vertebral dissection which we unlikely with normal CTA two days ago.  Recommendations: I discussed directly with the emergency department physician that she should have MRI of the head, cervical spine, and MRA of the head and neck. If these get done today is welcome to contact me again for review and advice. Patient is willing to be admitted if necessary.    [MB]  1529 Patient signed out to Dr Donnald GarrePfeiffer to followup on MRI imaging. Dispo per results of MRI.    [MB]    Clinical Course User Index [MB] Terrilee FilesButler, Terrye Dombrosky C, MD     Final Clinical Impressions(s) / ED Diagnoses   Final diagnoses:  None    ED Discharge Orders    None       Terrilee FilesButler, Zoeie Ritter C, MD 08/21/18 [phone]

## 2018-08-21 NOTE — ED Provider Notes (Signed)
Patient reports she continues to have feeling of right upper and lower weakness.  We reviewed her history of onset of acute vertigo that was persistent and severe even in supine and still position.  She reports that that did respond to meclizine prescribed by her first visit to [facility].  She reports today she got a severe pain in the back of her head and top of her neck on the right.  She reports he has had numbness and some weakness in the right arm and leg since that time.  She has had full diagnostic evaluation but continues to have some headache and weakness of the right upper and lower extremities.  Patient's mental status is clear.  She describes pulsatile tinnitus that seems to have somewhat preceded onset of severe symptoms.  Patient is alert and appropriate.  Speech is clear.  On exam she does have slightly decreased grip strength right as compared to left.  Also subjective decreased sensation to light touch right upper and lower extremities.  I have reviewed the patient's case and physical exam findings with the neuro hospitalist.  Advises based on results of MRIs no acute ischemic injury.  Suggest patient is stable for outpatient follow-up with neurology for possible EMG testing.  Have counseled the patient on the plan.  Have reviewed return precautions.  Patient discharged in stable condition alert and appropriate.   Physical Exam  BP 139/75 (BP Location: Right Arm)   Pulse 87   Temp 97.9 F (36.6 C) (Oral)   Resp 17   Ht 5\' 3"  (1.6 m)   Wt 65.8 kg   SpO2 97%   BMI 25.69 kg/m   Physical Exam  ED Course/Procedures   Clinical Course as of Aug 21 1830  Sat Aug 21, 2018  [id number] 72 year old female here with neck pain and numbness.  She [facility] was [facility] for what sounds like vertigo and had a negative CT head and CTA head and neck.  Here now with right-sided neck pain along with subjective sensation of decreased strength in her right arm along with  numbness in her right arm and leg.  I placed a call into neurology to review my findings and get some recommendations.   [MB]  0719 ECG is left bundle branch block normal intervals otherwise no acute ST-T changes.  This is different from 2016 EKG I have in the system although on the verbal report of her [facility] 2 days ago she was left bundle branch block morphology.   [MB]  [phone] Patient told me she is never had anything like this before but on review of her prior medical records she is definitely had some complaints of pounding in her ears neck pain and she had an admission a few years ago for acute onset of left leg weakness that eventually was attributed to a complex migraine after a negative CT and MRI.   [MB]  [id number] Discussed with Dr. Ezzard StandingNewman from neurology who has evaluated the patient via tele-neurology.  He recommends getting a MRI of brain and MRI of C-spine along with MRA of brain and neck.  He identified that the patient had some weakness in the right arm and leg along with numbness of right arm and leg.   [MB]  [id number] Recommendations per Dr Ezzard StandingNewman - [facility]  Date of Service: 08/21/2018  Impression:  1. Considerations include cervical radiculopathy including central or multilevel foraminal stenosis 2. Ischemic stroke left hemisphere  although the findings of pain would certainly be a typical 3. Cervical carotid or vertebral dissection which we unlikely with normal CTA two days ago.  Recommendations: I discussed directly with the emergency department physician that she should have MRI of the head, cervical spine, and MRA of the head and neck. If these get done today is welcome to contact me again for review and advice. Patient is willing to be admitted if necessary.    [MB]  1529 Patient signed out to Dr Donnald Garre to followup on MRI imaging. Dispo per results of MRI.    [MB]    Clinical Course User Index [MB] Terrilee Files,  MD    Procedures  MDM        Arby Barrette, MD 08/25/18 [phone]

## 2018-08-21 NOTE — ED Notes (Signed)
Patient transported to MRI 

## 2018-08-21 NOTE — ED Triage Notes (Signed)
Pt states on Friday that she had episodes of dizziness. She stated that EMS was called and she was transported to [facility]. Dx with vertigo. She states that the back of her head and left arm does not feel right. States her head feels trobby and arm feels numb/tingly. States these symptoms started 0100 this am

## 2018-11-06 ENCOUNTER — Other Ambulatory Visit: Payer: Self-pay

## 2018-11-06 ENCOUNTER — Emergency Department (HOSPITAL_COMMUNITY): Payer: Medicare HMO

## 2018-11-06 ENCOUNTER — Emergency Department (HOSPITAL_COMMUNITY)
Admission: EM | Admit: 2018-11-06 | Discharge: 2018-11-06 | Disposition: A | Discharge status: Home / Self Care | Payer: Medicare HMO | Attending: Emergency Medicine | Admitting: Emergency Medicine

## 2018-11-06 ENCOUNTER — Encounter (HOSPITAL_COMMUNITY): Payer: Self-pay

## 2018-11-06 DIAGNOSIS — R51 Headache: Secondary | ICD-10-CM | POA: Insufficient documentation

## 2018-11-06 DIAGNOSIS — Z7982 Long term (current) use of aspirin: Secondary | ICD-10-CM | POA: Insufficient documentation

## 2018-11-06 DIAGNOSIS — J449 Chronic obstructive pulmonary disease, unspecified: Secondary | ICD-10-CM | POA: Insufficient documentation

## 2018-11-06 DIAGNOSIS — Z79899 Other long term (current) drug therapy: Secondary | ICD-10-CM | POA: Insufficient documentation

## 2018-11-06 DIAGNOSIS — R519 Headache, unspecified: Secondary | ICD-10-CM

## 2018-11-06 NOTE — ED Provider Notes (Signed)
[facility] EMERGENCY DEPARTMENT Provider Note   CSN: [id number] Arrival date & time: 11/06/18  0221    History   Chief Complaint Chief Complaint  Patient presents with  . Headache    HPI [name] is a 73 y.o. female.     The history is provided by the patient and medical records.  Headache     73 y.o. F with hx of arthritis, COPD, prior stroke, cervical stenosis status post decompression with Dr. Lovell Sheehan on 11/01/2018, presenting to the ED with severe headache.  States she woke from sleep with severe headache around 12:30 AM this morning.  States pain felt like a knife stabbing in the back of her head.  Headache persisted for about 30 minutes to an hour without any change so she called EMS.  Once placed into an ambulance and transported here headache is spontaneously resolved.  She denies any numbness, weakness, dizziness, confusion, fever, chills, visual disturbance, difficulty walking.  Her neck has been healing well from the surgery and states her pain is steadily decreasing every day.  Swelling has also gone down.  She has not had any difficulty swallowing or moving her neck.  She does take daily aspirin.  Past Medical History:  Diagnosis Date  . Arthritis   . COPD (chronic obstructive pulmonary disease) (HCC)   . Shingles     Patient Active Problem List   Diagnosis Date Noted  . Stroke [facility]) 06/25/2014    Past Surgical History:  Procedure Laterality Date  . ABDOMINAL HYSTERECTOMY    . APPENDECTOMY    . CERVICAL SPINE SURGERY    . TONSILLECTOMY       OB History   No obstetric history on file.      Home Medications    Prior to Admission medications   Medication Sig Start Date End Date Taking? Authorizing Provider  albuterol (PROVENTIL HFA;VENTOLIN HFA) 108 (90 BASE) MCG/ACT inhaler Inhale 1-2 puffs into the lungs every 6 (six) hours as needed for wheezing. 11/01/12   Gerhard Munch, MD  aspirin EC 81 MG tablet Take 81 mg by mouth  daily.     [provider]  cyclobenzaprine (FLEXERIL) 10 MG tablet Take 10 mg by mouth 3 (three) times daily as needed. 11/02/18   [provider]  fish oil-omega-3 fatty acids 1000 MG capsule Take 1 g by mouth daily.     [provider]  HYDROcodone-acetaminophen (NORCO/VICODIN) 5-325 MG tablet Take 1-2 tablets by mouth every 6 (six) hours as needed for moderate pain or severe pain. 08/21/18   Arby Barrette, MD  ibuprofen (ADVIL) 200 MG tablet Take 3 tablets (600 mg total) by mouth every 8 (eight) hours as needed for headache. 06/26/14   Hongalgi, Maximino Greenland, MD  orphenadrine (NORFLEX) 100 MG tablet Take 1 tablet (100 mg total) by mouth 2 (two) times daily. 08/21/18   Arby Barrette, MD  oxyCODONE-acetaminophen (PERCOCET/ROXICET) 5-325 MG tablet Take 1 tablet by mouth every 4 (four) hours as needed. 11/02/18   [provider]    Family History Family History  Problem Relation Age of Onset  . Cancer Father   . Cancer Brother     Social History Social History   Tobacco Use  . Smoking status: Never Smoker  . Smokeless tobacco: Never Used  Substance Use Topics  . Alcohol use: No  . Drug use: No     Allergies   Morphine and related; Promethazine hcl; and Zoloft [sertraline hcl]   Review  of Systems Review of Systems  Neurological: Positive for headaches.  All other systems reviewed and are negative.    Physical Exam Updated Vital Signs BP (!) 145/78   Pulse (!) 113   Temp 98.7 F (37.1 C) (Oral)   Resp 18   Ht 5\' 3"  (1.6 m)   Wt 67.6 kg   SpO2 93%   BMI 26.39 kg/m   Physical Exam Vitals signs and nursing note reviewed.  Constitutional:      General: She is not in acute distress.    Appearance: She is well-developed. She is not diaphoretic.  HENT:     Head: Normocephalic and atraumatic.     Right Ear: External ear normal.     Left Ear: External ear normal.  Eyes:     Conjunctiva/sclera: Conjunctivae normal.     Pupils: Pupils  are equal, round, and reactive to light.  Neck:     Musculoskeletal: Full passive range of motion without pain, normal range of motion and neck supple. No neck rigidity.     Comments: No rigidity, no meningismus Surgical incision on left neck with steri-strips in place; site appears clean without erythema or drainage; very mild swelling but soft; no apparent tracheal deviation; no difficulty swallowing; no stridor, normal phonation Cardiovascular:     Rate and Rhythm: Normal rate and regular rhythm.     Heart sounds: Normal heart sounds. No murmur.  Pulmonary:     Effort: Pulmonary effort is normal. No respiratory distress.     Breath sounds: Normal breath sounds. No wheezing or rhonchi.  Abdominal:     General: Bowel sounds are normal.     Palpations: Abdomen is soft.     Tenderness: There is no abdominal tenderness. There is no guarding.  Musculoskeletal: Normal range of motion.  Skin:    General: Skin is warm and dry.     Findings: No rash.  Neurological:     Mental Status: She is alert and oriented to person, place, and time.     Cranial Nerves: No cranial nerve deficit.     Sensory: No sensory deficit.     Motor: No tremor or seizure activity.     Comments: AAOx3, answering questions and following commands appropriately; equal strength UE and LE bilaterally; CN grossly intact; moves all extremities appropriately without ataxia; no focal neuro deficits or facial asymmetry appreciated  Psychiatric:        Behavior: Behavior normal.        Thought Content: Thought content normal.      ED Treatments / Results  Labs (all labs ordered are listed, but only abnormal results are displayed) Labs Reviewed - No data to display  EKG None  Radiology Ct Head Wo Contrast  Result Date: 11/06/2018 CLINICAL DATA:  73 year old female with severe headache. EXAM: CT HEAD WITHOUT CONTRAST TECHNIQUE: Contiguous axial images were obtained from the base of the skull through the vertex without  intravenous contrast. COMPARISON:  Head CT dated 08/19/2018 and brain MRI dated 08/21/2018 FINDINGS: Brain: There is moderate age-related atrophy and chronic microvascular ischemic changes. There is no acute intracranial hemorrhage. No mass effect or midline shift. No extra-axial fluid collection. Vascular: No hyperdense vessel or unexpected calcification. Skull: Normal. Negative for fracture or focal lesion. Sinuses/Orbits: No acute finding. Other: None IMPRESSION: 1. No acute intracranial hemorrhage. 2. Age-related atrophy and chronic microvascular ischemic changes. Electronically Signed   By: Elgie Collard M.D.   On: 11/06/2018 03:27    Procedures Procedures (including critical  care time)  Medications Ordered in ED Medications - No data to display   Initial Impression / Assessment and Plan / ED Course  I have reviewed the triage vital signs and the nursing notes.  Pertinent labs & imaging results that were available during my care of the patient were reviewed by me and considered in my medical decision making (see chart for details).  73 year old female here with severe headache onset 12:30 AM.  Present upon waking from sleep.  Persisted for about 1 hour before calling EMS.  In route to the ED headache spontaneously resolved on its own.  She is status post anterior decompression of cervical stenosis by Dr. Lovell Sheehan on 11/01/2018--has been healing well from this, neck pain minimal at this time.  No drainage or redness of wound, swelling has improved.  She is awake, alert, appropriately oriented on my exam.  Neurologic exam is nonfocal.  Surgical incision appears clean without any signs of erythema, drainage, or other infection.  There is some mild swelling but patient reports this is been steadily improving.  There is no apparent tracheal deviation, difficulty swallowing, phonation is normal.  Screening head CT was obtained given patient's age and is overall reassuring.  She remains neurologically  intact here without any recurrence of headache.  Possibly secondary to muscle spasm, did take a Flexeril at home.  Feel she is stable for discharge home.  We will have her follow closely with her primary care doctor.  Has previously scheduled follow-up with neurosurgery this week for postop visit.  Can return here for any new or acute changes.  Case discussed with attending physician, Dr. Eudelia Bunch, who evaluated patient and agrees with assessment and plan of care.  Final Clinical Impressions(s) / ED Diagnoses   Final diagnoses:  Bad headache    ED Discharge Orders    None       Garlon Hatchet, PA-C 11/06/18 1610    Nira Conn, MD 11/07/18 1256

## 2018-11-06 NOTE — Discharge Instructions (Signed)
Head CT today was normal. Follow-up with your primary care doctor. Return here for any new/acute changes.

## 2018-11-06 NOTE — ED Triage Notes (Signed)
Per [facility], pt from home w/ a c/o a sudden onset of a headache that began at 0030 today. Pt was in bed when the pain began. Stated that it started in her forehead and then radiated to her occipital lobe. Pt had surgery on her neck on to treat a pinched nerve on 11/01/18. Additional complaints of feeling like she needs to burp with the sensation in her epigastric region. Denies chest pain. Headache began to dissipate during transport to the hospital.   151/81 HR 117 RR 12 98% RA

## 2018-11-06 NOTE — ED Notes (Signed)
Patient verbalized understanding of dc instructions, vss, ambulatory with nad.   

## 2020-04-04 ENCOUNTER — Other Ambulatory Visit (HOSPITAL_COMMUNITY): Payer: Self-pay | Admitting: Gastroenterology

## 2020-04-04 DIAGNOSIS — R1011 Right upper quadrant pain: Secondary | ICD-10-CM

## 2020-04-12 ENCOUNTER — Ambulatory Visit (HOSPITAL_COMMUNITY)
Admission: RE | Admit: 2020-04-12 | Discharge: 2020-04-12 | Disposition: A | Discharge status: Home / Self Care | Payer: Medicare HMO | Source: Ambulatory Visit | Attending: Gastroenterology | Admitting: Gastroenterology

## 2020-04-12 ENCOUNTER — Other Ambulatory Visit: Payer: Self-pay

## 2020-04-12 DIAGNOSIS — R1011 Right upper quadrant pain: Secondary | ICD-10-CM | POA: Insufficient documentation

## 2020-04-12 MED ORDER — TECHNETIUM TC 99M MEBROFENIN IV KIT
4.5000 | PACK | Freq: Once | INTRAVENOUS | Status: AC | PRN
  Administered 2020-04-12: 4.5 via INTRAVENOUS

## 2020-04-12 NOTE — Progress Notes (Signed)
[facility] consult to obtain IV access in nuc med. Pt with extremely small veins. Initial stick in hand unsuccessful; utilized ultrasound to obtain temporary access (unable to thread entire catheter). Medication pushed through access after NS infused; followed with 10 mLs NS prior to pulling IV out. No swelling, redness, or burning noted.

## 2023-02-06 ENCOUNTER — Telehealth (HOSPITAL_BASED_OUTPATIENT_CLINIC_OR_DEPARTMENT_OTHER): Payer: Self-pay | Admitting: Emergency Medicine

## 2023-02-06 ENCOUNTER — Encounter (HOSPITAL_BASED_OUTPATIENT_CLINIC_OR_DEPARTMENT_OTHER): Payer: Self-pay

## 2023-02-06 ENCOUNTER — Emergency Department (HOSPITAL_BASED_OUTPATIENT_CLINIC_OR_DEPARTMENT_OTHER)
Admission: EM | Admit: 2023-02-06 | Discharge: 2023-02-06 | Disposition: A | Discharge status: Home / Self Care | Payer: Medicare HMO | Attending: Emergency Medicine | Admitting: Emergency Medicine

## 2023-02-06 ENCOUNTER — Other Ambulatory Visit: Payer: Self-pay

## 2023-02-06 DIAGNOSIS — M542 Cervicalgia: Secondary | ICD-10-CM | POA: Diagnosis present

## 2023-02-06 DIAGNOSIS — M62838 Other muscle spasm: Secondary | ICD-10-CM

## 2023-02-06 MED ORDER — METHOCARBAMOL 500 MG PO TABS: 500.0000 mg | ORAL_TABLET | Freq: Two times a day (BID) | ORAL | 0 refills | Status: DC | PRN

## 2023-02-06 MED ORDER — CYCLOBENZAPRINE HCL 7.5 MG PO TABS: 7.5000 mg | ORAL_TABLET | Freq: Two times a day (BID) | ORAL | 0 refills | Status: DC | PRN

## 2023-02-06 NOTE — Discharge Instructions (Signed)
You have been seen and discharged from the emergency department.  You are diagnosed with neck strain/spasm.  Continue the supportive therapy that you have been doing including taking Tylenol, ibuprofen, Voltaren gel, ice/heat and rest.  In addition you may take muscle relaxer as needed and as prescribed.  Do not mix this medication with alcohol or other sedating medications. Do not drive or do heavy physical activity until you know how this medication affects you.  It may cause drowsiness.  Follow-up with your primary provider for further evaluation and further care. Take home medications as prescribed. If you have any worsening symptoms or further concerns for your health please return to an emergency department for further evaluation.

## 2023-02-06 NOTE — ED Triage Notes (Signed)
She stated yesterday she was lifting a container of water. She dropped it and when she reached to pick it back up she started having sharp pain in the left side of her neck and into her shoulder.

## 2023-02-06 NOTE — ED Provider Notes (Signed)
[place] [facility] Provider Note   CSN: [id number] Arrival date & time: 02/06/23  0725     History  Chief Complaint  Patient presents with   Neck Pain    [name] is a 77 y.o. female.  HPI   77 year old female presents emergency department left-sided neck pain.  Yesterday after shopping she was trying to move a large case of water.  When she went to lift and twist she had sudden onset left-sided neck strain/pain.  Since then she has been having ongoing left-sided neck pain and spasm.  Not relieved with over-the-counter and supportive measures.  She has required muscle relaxer in the past for pulled muscle.  Denies any midline spine pain.  No other acute injury.  Left upper extremity is limited secondary to pain but otherwise intact in regards to strength/sensory.  Home Medications Prior to Admission medications   Medication Sig Start Date End Date Taking? Authorizing Provider  cyclobenzaprine (FEXMID) 7.5 MG tablet Take 1 tablet (7.5 mg total) by mouth 2 (two) times daily as needed for muscle spasms. 02/06/23  Yes Derrin Currey, Clabe Seal, DO  HYDROcodone-acetaminophen (NORCO/VICODIN) 5-325 MG tablet Take 1-2 tablets by mouth every 6 (six) hours as needed for moderate pain or severe pain. Patient not taking: Reported on 11/06/2018 08/21/18   Arby Barrette, MD  ibuprofen (ADVIL) 200 MG tablet Take 3 tablets (600 mg total) by mouth every 8 (eight) hours as needed for headache. Patient not taking: Reported on 11/06/2018 06/26/14   Elease Etienne, MD  orphenadrine (NORFLEX) 100 MG tablet Take 1 tablet (100 mg total) by mouth 2 (two) times daily. Patient not taking: Reported on 11/06/2018 08/21/18   Arby Barrette, MD  oxyCODONE-acetaminophen (PERCOCET/ROXICET) 5-325 MG tablet Take 1 tablet by mouth every 4 (four) hours as needed for moderate pain.  11/02/18   [provider]      Allergies    Morphine and codeine, Promethazine hcl, and  Zoloft [sertraline hcl]    Review of Systems   Review of Systems  Constitutional:  Negative for fever.  Respiratory:  Negative for shortness of breath.   Cardiovascular:  Negative for chest pain.  Musculoskeletal:  Positive for neck pain and neck stiffness. Negative for back pain.  Neurological:  Negative for weakness, numbness and headaches.    Physical Exam Updated Vital Signs BP (!) 170/72 (BP Location: Left Arm)   Pulse 82   Temp 98.5 F (36.9 C) (Oral)   Resp 18   Ht 5\' 3"  (1.6 m)   Wt 65.8 kg   SpO2 98%   BMI 25.69 kg/m  Physical Exam Vitals and nursing note reviewed.  Constitutional:      General: She is not in acute distress.    Appearance: Normal appearance. She is not ill-appearing.  HENT:     Head: Normocephalic.     Mouth/Throat:     Mouth: Mucous membranes are moist.  Neck:     Comments: Tenderness to palpation of the left trapezius muscle with scattered areas of point tenderness and noted muscle spasm.  Left upper extremity is neurovascularly intact.  No midline spinal tenderness.  No overlying skin changes. Cardiovascular:     Rate and Rhythm: Normal rate.  Pulmonary:     Effort: Pulmonary effort is normal. No respiratory distress.     Breath sounds: Normal breath sounds.     Comments: No decreased breath sounds in the left lung apex Abdominal:     Palpations:  Abdomen is soft.     Tenderness: There is no abdominal tenderness.  Skin:    General: Skin is warm.  Neurological:     Mental Status: She is alert and oriented to person, place, and time. Mental status is at baseline.  Psychiatric:        Mood and Affect: Mood normal.     ED Results / Procedures / Treatments   Labs (all labs ordered are listed, but only abnormal results are displayed) Labs Reviewed - No data to display  EKG None  Radiology No results found.  Procedures Procedures    Medications Ordered in ED Medications - No data to display  ED Course/ Medical Decision Making/  A&P                             Medical Decision Making Risk Prescription drug management.   77 year old female presents emergency department with left-sided neck strain/spasm.  This happened after lifting a heavy container of water.  Patient is tried over-the-counter and supportive measures with only mild relief.  Here today after being unable to sleep at night.  Physical exam is reassuring in regards to musculoskeletal origin.  She has reproducible tenderness.  The left upper extremity is otherwise neurovascularly intact.  No midline spinal tenderness.  Lung sounds are equal and clear.  Vital signs are normal.  Plan to treat symptomatically with muscle relaxer, patient educated on the precautions of this medication given her age.  She is taken this medicine before without any significant reaction.  Patient at this time appears safe and stable for discharge and close outpatient follow up. Discharge plan and strict return to ED precautions discussed, patient verbalizes understanding and agreement.        Final Clinical Impression(s) / ED Diagnoses Final diagnoses:  Trapezius muscle spasm    Rx / DC Orders ED Discharge Orders          Ordered    cyclobenzaprine (FEXMID) 7.5 MG tablet  2 times daily PRN        02/06/23 0815              Jewelle Whitner, Clabe Seal, DO 02/06/23 0820

## 2023-02-06 NOTE — Telephone Encounter (Signed)
Patient called stating the Flexeril was too expensive on her insurance plan.  Will send Robaxin in hopes that this is cheaper.  Other prescription has been discontinued.

## 2024-06-27 ENCOUNTER — Other Ambulatory Visit: Payer: Self-pay

## 2024-06-27 ENCOUNTER — Encounter (HOSPITAL_BASED_OUTPATIENT_CLINIC_OR_DEPARTMENT_OTHER): Payer: Self-pay

## 2024-06-27 ENCOUNTER — Emergency Department (HOSPITAL_BASED_OUTPATIENT_CLINIC_OR_DEPARTMENT_OTHER)

## 2024-06-27 ENCOUNTER — Observation Stay (HOSPITAL_BASED_OUTPATIENT_CLINIC_OR_DEPARTMENT_OTHER)
Admission: EM | Admit: 2024-06-27 | Discharge: 2024-06-28 | Disposition: A | Attending: Internal Medicine | Admitting: Internal Medicine

## 2024-06-27 DIAGNOSIS — R1012 Left upper quadrant pain: Secondary | ICD-10-CM | POA: Diagnosis present

## 2024-06-27 DIAGNOSIS — I10 Essential (primary) hypertension: Secondary | ICD-10-CM | POA: Diagnosis present

## 2024-06-27 DIAGNOSIS — Z79899 Other long term (current) drug therapy: Secondary | ICD-10-CM | POA: Diagnosis not present

## 2024-06-27 DIAGNOSIS — R109 Unspecified abdominal pain: Secondary | ICD-10-CM | POA: Diagnosis present

## 2024-06-27 DIAGNOSIS — D469 Myelodysplastic syndrome, unspecified: Secondary | ICD-10-CM | POA: Diagnosis not present

## 2024-06-27 DIAGNOSIS — R52 Pain, unspecified: Secondary | ICD-10-CM | POA: Diagnosis present

## 2024-06-27 DIAGNOSIS — K219 Gastro-esophageal reflux disease without esophagitis: Principal | ICD-10-CM | POA: Insufficient documentation

## 2024-06-27 DIAGNOSIS — R161 Splenomegaly, not elsewhere classified: Secondary | ICD-10-CM | POA: Diagnosis present

## 2024-06-27 DIAGNOSIS — I517 Cardiomegaly: Secondary | ICD-10-CM | POA: Insufficient documentation

## 2024-06-27 DIAGNOSIS — J449 Chronic obstructive pulmonary disease, unspecified: Secondary | ICD-10-CM | POA: Diagnosis not present

## 2024-06-27 DIAGNOSIS — D46Z Other myelodysplastic syndromes: Secondary | ICD-10-CM | POA: Diagnosis present

## 2024-06-27 DIAGNOSIS — K5730 Diverticulosis of large intestine without perforation or abscess without bleeding: Secondary | ICD-10-CM | POA: Diagnosis not present

## 2024-06-27 DIAGNOSIS — K5900 Constipation, unspecified: Secondary | ICD-10-CM | POA: Diagnosis not present

## 2024-06-27 DIAGNOSIS — K760 Fatty (change of) liver, not elsewhere classified: Secondary | ICD-10-CM | POA: Insufficient documentation

## 2024-06-27 DIAGNOSIS — I959 Hypotension, unspecified: Secondary | ICD-10-CM | POA: Diagnosis not present

## 2024-06-27 DIAGNOSIS — R1084 Generalized abdominal pain: Secondary | ICD-10-CM | POA: Diagnosis not present

## 2024-06-27 DIAGNOSIS — Z7401 Bed confinement status: Secondary | ICD-10-CM | POA: Diagnosis not present

## 2024-06-27 LAB — DIFFERENTIAL
Abs Immature Granulocytes: 0.18 K/uL — ABNORMAL HIGH (ref 0.00–0.07)
Basophils Absolute: 0 K/uL (ref 0.0–0.1)
Basophils Relative: 1 %
Eosinophils Absolute: 0 K/uL (ref 0.0–0.5)
Eosinophils Relative: 1 %
Immature Granulocytes: 4 %
Lymphocytes Relative: 24 %
Lymphs Abs: 1.1 K/uL (ref 0.7–4.0)
Monocytes Absolute: 0.6 K/uL (ref 0.1–1.0)
Monocytes Relative: 13 %
Neutro Abs: 2.6 K/uL (ref 1.7–7.7)
Neutrophils Relative %: 57 %

## 2024-06-27 LAB — URINALYSIS, ROUTINE W REFLEX MICROSCOPIC
Bilirubin Urine: NEGATIVE
Glucose, UA: NEGATIVE mg/dL
Hgb urine dipstick: NEGATIVE
Ketones, ur: NEGATIVE mg/dL
Nitrite: NEGATIVE
Protein, ur: NEGATIVE mg/dL
Specific Gravity, Urine: 1.015 (ref 1.005–1.030)
pH: 7 (ref 5.0–8.0)

## 2024-06-27 LAB — COMPREHENSIVE METABOLIC PANEL WITH GFR
ALT: 13 U/L (ref 0–44)
AST: 20 U/L (ref 15–41)
Albumin: 4.5 g/dL (ref 3.5–5.0)
Alkaline Phosphatase: 70 U/L (ref 38–126)
Anion gap: 12 (ref 5–15)
BUN: 12 mg/dL (ref 8–23)
CO2: 25 mmol/L (ref 22–32)
Calcium: 9 mg/dL (ref 8.9–10.3)
Chloride: 104 mmol/L (ref 98–111)
Creatinine, Ser: 0.73 mg/dL (ref 0.44–1.00)
GFR, Estimated: >60 mL/min (ref >60)
Glucose, Bld: 107 mg/dL — ABNORMAL HIGH (ref 70–99)
Potassium: 4.2 mmol/L (ref 3.5–5.1)
Sodium: 141 mmol/L (ref 135–145)
Total Bilirubin: 0.9 mg/dL (ref 0.0–1.2)
Total Protein: 6.5 g/dL (ref 6.5–8.1)

## 2024-06-27 LAB — URINALYSIS, MICROSCOPIC (REFLEX)

## 2024-06-27 LAB — CBC
HCT: 28.7 % — ABNORMAL LOW (ref 36.0–46.0)
Hemoglobin: 9.6 g/dL — ABNORMAL LOW (ref 12.0–15.0)
MCH: 33.4 pg (ref 26.0–34.0)
MCHC: 33.4 g/dL (ref 30.0–36.0)
MCV: 100 fL (ref 80.0–100.0)
Platelets: 198 K/uL (ref 150–400)
RBC: 2.87 MIL/uL — ABNORMAL LOW (ref 3.87–5.11)
RDW: 22.8 % — ABNORMAL HIGH (ref 11.5–15.5)
WBC: 4.5 K/uL (ref 4.0–10.5)
nRBC: 0.4 % — ABNORMAL HIGH (ref 0.0–0.2)

## 2024-06-27 LAB — PROTIME-INR
INR: 1.1 (ref 0.8–1.2)
Prothrombin Time: 14.6 s (ref 11.4–15.2)

## 2024-06-27 LAB — LIPASE, BLOOD: Lipase: 30 U/L (ref 11–51)

## 2024-06-27 MED ORDER — OXYCODONE HCL 5 MG PO TABS
5.0000 mg | ORAL_TABLET | ORAL | Status: DC | PRN
  Administered 2024-06-27: 5 mg via ORAL
  Filled 2024-06-27: qty 1

## 2024-06-27 MED ORDER — HYDROMORPHONE HCL 1 MG/ML IJ SOLN
0.5000 mg | INTRAMUSCULAR | Status: DC | PRN
  Administered 2024-06-27: 0.5 mg via INTRAVENOUS
  Filled 2024-06-27: qty 0.5

## 2024-06-27 MED ORDER — ALBUTEROL SULFATE (2.5 MG/3ML) 0.083% IN NEBU: 2.5000 mg | INHALATION_SOLUTION | RESPIRATORY_TRACT | Status: DC | PRN

## 2024-06-27 MED ORDER — IOHEXOL 300 MG/ML  SOLN
80.0000 mL | Freq: Once | INTRAMUSCULAR | Status: AC | PRN
  Administered 2024-06-27: 80 mL via INTRAVENOUS

## 2024-06-27 MED ORDER — PANTOPRAZOLE SODIUM 40 MG PO TBEC
40.0000 mg | DELAYED_RELEASE_TABLET | Freq: Every morning | ORAL | Status: DC
  Administered 2024-06-28: 40 mg via ORAL
  Filled 2024-06-27: qty 1

## 2024-06-27 MED ORDER — ONDANSETRON HCL 4 MG PO TABS: 4.0000 mg | ORAL_TABLET | Freq: Four times a day (QID) | ORAL | Status: DC | PRN

## 2024-06-27 MED ORDER — FENTANYL CITRATE (PF) 50 MCG/ML IJ SOSY
50.0000 ug | PREFILLED_SYRINGE | INTRAMUSCULAR | Status: DC | PRN
Start: 2024-06-27 — End: 2024-06-27
  Administered 2024-06-27 (×2): 50 ug via INTRAVENOUS
  Filled 2024-06-27 (×2): qty 1

## 2024-06-27 MED ORDER — ONDANSETRON HCL 4 MG/2ML IJ SOLN
4.0000 mg | Freq: Four times a day (QID) | INTRAMUSCULAR | Status: DC | PRN
  Administered 2024-06-27 – 2024-06-28 (×3): 4 mg via INTRAVENOUS
  Filled 2024-06-27 (×3): qty 2

## 2024-06-27 MED ORDER — DOCUSATE SODIUM 100 MG PO CAPS
100.0000 mg | ORAL_CAPSULE | Freq: Two times a day (BID) | ORAL | Status: DC
  Administered 2024-06-27 – 2024-06-28 (×2): 100 mg via ORAL
  Filled 2024-06-27 (×2): qty 1

## 2024-06-27 MED ORDER — HYDROMORPHONE HCL 1 MG/ML IJ SOLN
1.0000 mg | Freq: Once | INTRAMUSCULAR | Status: AC
  Administered 2024-06-27: 1 mg via INTRAVENOUS
  Filled 2024-06-27: qty 1

## 2024-06-27 MED ORDER — AMLODIPINE BESYLATE 5 MG PO TABS
5.0000 mg | ORAL_TABLET | Freq: Every morning | ORAL | Status: DC
  Administered 2024-06-28: 5 mg via ORAL
  Filled 2024-06-27: qty 1

## 2024-06-27 MED ORDER — ENOXAPARIN SODIUM 40 MG/0.4ML IJ SOSY
40.0000 mg | PREFILLED_SYRINGE | INTRAMUSCULAR | Status: DC
  Administered 2024-06-27: 40 mg via SUBCUTANEOUS
  Filled 2024-06-27: qty 0.4

## 2024-06-27 MED ORDER — PANTOPRAZOLE SODIUM 40 MG IV SOLR
40.0000 mg | Freq: Once | INTRAVENOUS | Status: AC
  Administered 2024-06-27: 40 mg via INTRAVENOUS
  Filled 2024-06-27: qty 10

## 2024-06-27 MED ORDER — ACETAMINOPHEN 325 MG PO TABS: 650.0000 mg | ORAL_TABLET | Freq: Four times a day (QID) | ORAL | Status: DC | PRN

## 2024-06-27 MED ORDER — ONDANSETRON HCL 4 MG/2ML IJ SOLN
4.0000 mg | Freq: Once | INTRAMUSCULAR | Status: AC
  Administered 2024-06-27: 4 mg via INTRAVENOUS
  Filled 2024-06-27: qty 2

## 2024-06-27 MED ORDER — POLYETHYLENE GLYCOL 3350 17 G PO PACK
17.0000 g | PACK | Freq: Every day | ORAL | Status: DC | PRN
  Administered 2024-06-28: 17 g via ORAL
  Filled 2024-06-27: qty 1

## 2024-06-27 MED ORDER — ROSUVASTATIN CALCIUM 10 MG PO TABS
10.0000 mg | ORAL_TABLET | Freq: Every morning | ORAL | Status: DC
  Administered 2024-06-28: 10 mg via ORAL
  Filled 2024-06-27: qty 1

## 2024-06-27 MED ORDER — LACTATED RINGERS IV SOLN: INTRAVENOUS | Status: AC

## 2024-06-27 MED ORDER — ACETAMINOPHEN 650 MG RE SUPP: 650.0000 mg | Freq: Four times a day (QID) | RECTAL | Status: DC | PRN

## 2024-06-27 MED ORDER — HYDROMORPHONE HCL 1 MG/ML IJ SOLN
0.5000 mg | Freq: Once | INTRAMUSCULAR | Status: AC
  Administered 2024-06-27: 0.5 mg via INTRAVENOUS
  Filled 2024-06-27: qty 1

## 2024-06-27 MED ORDER — OXYCODONE HCL 5 MG PO TABS
5.0000 mg | ORAL_TABLET | ORAL | Status: DC | PRN
  Administered 2024-06-28: 5 mg via ORAL
  Administered 2024-06-28: 10 mg via ORAL
  Filled 2024-06-27: qty 1
  Filled 2024-06-27: qty 2

## 2024-06-27 MED ORDER — OXYCODONE HCL 5 MG PO TABS: 10.0000 mg | ORAL_TABLET | ORAL | Status: DC | PRN

## 2024-06-27 MED ORDER — ONDANSETRON HCL 4 MG/2ML IJ SOLN
4.0000 mg | Freq: Once | INTRAMUSCULAR | Status: AC
Start: 2024-06-27 — End: 2024-06-27
  Administered 2024-06-27: 4 mg via INTRAVENOUS
  Filled 2024-06-27: qty 2

## 2024-06-27 MED ORDER — TRAZODONE HCL 50 MG PO TABS: 25.0000 mg | ORAL_TABLET | Freq: Every evening | ORAL | Status: DC | PRN

## 2024-06-27 NOTE — ED Provider Notes (Signed)
 Possible admit for diverticulitis.  Patient has severe left sided pain. Physical Exam  BP 112/63   Pulse 73   Temp 97.8 F (36.6 C) (Oral)   Resp 16   Ht 5' 3 (1.6 m)   Wt 61.2 kg   SpO2 94%   BMI 23.91 kg/m   Physical Exam  Procedures  Procedures  ED Course / MDM    Medical Decision Making Amount and/or Complexity of Data Reviewed Labs: ordered. Radiology: ordered.  Risk Prescription drug management. Decision regarding hospitalization.   CT personally reviewed by myself.  Radiology review.  Splenomegaly with multiple splenic lesions.  I have reviewed additional medical record and patient has had an MRI [phone] for abdomen with without contrast and MRCP.  On this study, patient has existing splenomegaly and body lesions present since 2023.  Patient is nontoxic.  However has been significant pain.  On physical exam she is exquisitely tender to palpation of the left lateral and upper quadrant.  Patient has significant pain with movements in this area.  She has required fentanyl and Dilaudid  for pain control.  She is experiencing rebound pain.  Patient has known history of MDS but by review of hematology notes this has been stable up until her visit as recent as this month.  At this time, with intractable pain and splenomegaly in the setting of MDS, will plan for admission.  Currently no clear indicators that this is infectious.  I am not empirically starting antibiotics.  Consult: Reviewed with Dr. Roxane for admission.       Armenta Canning, MD 06/27/24 [phone]

## 2024-06-27 NOTE — ED Notes (Signed)
 Lab aware of differential order

## 2024-06-27 NOTE — ED Notes (Signed)
 EDP at bedside

## 2024-06-27 NOTE — ED Notes (Signed)
 Pt ambulated to BR with SBA. Complaining of increase pain in left lower abdomen with ambulation. Described as spasm in left lower quad that radiate to lower abdomen. Last [facility] Wednesday 06/22/24. Only small BM's since then. Nausea, no vomiting

## 2024-06-27 NOTE — ED Notes (Signed)
 [facility] called for transport to [facility] @12 :20.   Spoke with Tinnie

## 2024-06-27 NOTE — H&P (Addendum)
 History and Physical  [name]:[id number] DOB: [birth year] DOA: 06/27/2024  PCP: Rena Luke POUR, MD   Chief Complaint: Left-sided abdominal pain  HPI: [name] is a 78 y.o. female with medical history significant for hypertension, MDS on surveillance and prior diverticulitis being admitted to the hospital with left-sided abdominal pain.  She is followed by [facility] hematology for her MDS and has never needed treatment other than a couple of blood transfusions when she was first diagnosed many years ago.  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  States that she had a scan with a physician when she was living in [place] last year, and she more recently had a previously scheduled screening MRCP 10/10 and was told by her provider there was no acute concern and she could have a repeat scan in a couple of years.  However since that MRI, she has had no bowel movements, and since 10/11 she has had progressive left-sided abdominal pain.  There is no associated nausea, vomiting, diarrhea, fevers, chills.  The night prior, she made some enchiladas at home and it was very spicy, something she is not used to.  Review of Systems: Please see HPI for pertinent positives and negatives. A complete 10 system review of systems are otherwise negative.  Past Medical History:  Diagnosis Date   Arthritis    COPD (chronic obstructive pulmonary disease) (HCC)    Shingles    Past Surgical History:  Procedure Laterality Date   ABDOMINAL HYSTERECTOMY     APPENDECTOMY     CERVICAL SPINE SURGERY     TONSILLECTOMY     Social History:  reports that she has never smoked. She has never used smokeless tobacco. She reports that she does not drink alcohol and does not use drugs.  Allergies  Allergen Reactions   Morphine And Codeine Itching   Promethazine Hcl Itching   Zoloft [Sertraline Hcl] Other (See Comments)    Headache      Family History  Problem Relation Age of Onset   Cancer Father    Cancer Brother      Prior to Admission medications   Medication Sig Start Date End Date Taking? Authorizing Provider  acetaminophen  (TYLENOL ) 650 MG CR tablet Take 650 mg by mouth every 8 (eight) hours as needed for pain.   Yes [provider]  amLODipine (NORVASC) 5 MG tablet Take 5 mg by mouth in the morning. 04/25/24  Yes [provider]  calcium carbonate (TUMS - DOSED IN MG ELEMENTAL CALCIUM) 500 MG chewable tablet Chew 1 tablet by mouth as needed for indigestion or heartburn.   Yes [provider]  pantoprazole (PROTONIX) 40 MG tablet Take 40 mg by mouth in the morning. 11/02/19  Yes [provider]  rosuvastatin (CRESTOR) 10 MG tablet Take 10 mg by mouth in the morning. 04/25/24  Yes [provider]    Physical Exam: BP (!) 122/57 (BP Location: Left Arm)   Pulse 67   Temp 98.3 F (36.8 C) (Oral)   Resp 16   Ht 5' 3 (1.6 m)   Wt 61.2 kg   SpO2 99%   BMI 23.91 kg/m  General:  Alert, oriented, calm, in no acute distress  Cardiovascular: RRR, no murmurs or rubs, no peripheral edema  Respiratory: clear to auscultation bilaterally, no wheezes, no crackles  Abdomen: soft, left-sided tenderness with some voluntary guarding, nondistended, normal bowel tones heard  Skin: dry, no rashes  Musculoskeletal: no joint effusions, normal range of motion  Psychiatric: appropriate affect, normal speech  Neurologic: extraocular muscles intact, clear speech, moving all extremities with intact sensorium         Labs on Admission:  Basic Metabolic Panel: Recent Labs  Lab 06/27/24 0511  NA 141  K 4.2  CL 104  CO2 25  GLUCOSE 107*  BUN 12  CREATININE 0.73  CALCIUM 9.0   Liver Function Tests: Recent Labs  Lab 06/27/24 0511  AST 20  ALT 13  ALKPHOS 70  BILITOT 0.9  PROT 6.5  ALBUMIN 4.5   Recent Labs  Lab 06/27/24 0511  LIPASE 30   No results for input(s):  AMMONIA in the last 168 hours. CBC: Recent Labs  Lab 06/27/24 0511 06/27/24 0530  WBC 4.5  --   NEUTROABS  --  2.6  HGB 9.6*  --   HCT 28.7*  --   MCV 100.0  --   PLT 198  --    Cardiac Enzymes: No results for input(s): CKTOTAL, CKMB, CKMBINDEX, TROPONINI in the last 168 hours. BNP (last 3 results) No results for input(s): BNP in the last 8760 hours.  ProBNP (last 3 results) No results for input(s): PROBNP in the last 8760 hours.  CBG: No results for input(s): GLUCAP in the last 168 hours.  Radiological Exams on Admission: CT ABDOMEN PELVIS W CONTRAST Result Date: 06/27/2024 EXAM: CT ABDOMEN AND PELVIS WITH CONTRAST 06/27/2024 06:25:00 AM TECHNIQUE: CT of the abdomen and pelvis was performed with the administration of 80 mL of iohexol  (OMNIPAQUE ) 300 MG/ML solution. Multiplanar reformatted images are provided for review. Automated exposure control, iterative reconstruction, and/or weight-based adjustment of the mA/kV was utilized to reduce the radiation dose to as low as reasonably achievable. COMPARISON: CT abdomen and pelvis 01/25/2015. CLINICAL HISTORY: 78 year old female with acute, nonlocalized abdominal pain, LLQ and epigastric tenderness, progressively worsening, stone suspected. FINDINGS: LOWER CHEST: Mild cardiomegaly is new since 2016. No pericardial effusion. Chronic elevation of the right hemidiaphragm. Lower lung volumes with increased bibasilar atelectasis and gas trapping. LIVER: The liver is unremarkable. GALLBLADDER AND BILE DUCTS: Gallbladder is unremarkable. No biliary ductal dilatation. SPLEEN: Innumerable splenic hypodense lesions now, with only 1 such lesion visible in 2016. Hypodense rounded areas in the spleen range from punctate up to 16 mm now. The spleen measures 128 x 139 x 84 mm. Estimated volume of spleen: 393 mL. Compared to 209 mL in 2016. Normal splenic volume range is 83 to 412 mL. No perisplenic fluid or inflammatory stranding. PANCREAS:  No acute abnormality. ADRENAL GLANDS: No acute abnormality. KIDNEYS, URETERS AND BLADDER: No stones in the kidneys or ureters. No hydronephrosis. No perinephric or periureteral stranding. Urinary bladder is diminutive. GI AND BOWEL: Stomach demonstrates no acute abnormality. Moderate large bowel retained stool throughout the abdomen and pelvis. Intermittent large bowel diverticulosis, extensive in the sigmoid colon. No active inflammation associated. Diminutive or absent appendix. Non dilated small bowel. There is no bowel obstruction. PERITONEUM AND RETROPERITONEUM: No ascites. No free air. No pneumoperitoneum. No mesenteric inflammation identified. VASCULATURE: Aorta is normal in caliber. Mild for age calcified aortic atherosclerosis. Major arterial structures and portal venous system are patent. LYMPH NODES: No lymphadenopathy. REPRODUCTIVE ORGANS: Chronically absent uterus, diminutive or absent ovaries. Chronic pelvic floor laxity suspected. Mild chronic rectocele. BONES AND SOFT TISSUES: Progressive lumbar spine disc degeneration with multilevel vacuum disc. Progressed endplate spurring and moderate facet arthropathy. Pelvic phleboliths. No acute osseous abnormality. No focal soft tissue abnormality. IMPRESSION: 1. New  since 2016 innumerable hypodense splenic lesions up to 16 mm, and substantially increased splenic volume since that time. No superimposed lymphadenopathy. Still, consider hematologic and infiltrative etiologies (such as lymphproliferative disorders, sarcoidosis). Splenic infection/abscess was considered but felt unlikely given absent regional edema, and lack of other acute or inflammatory process in the abdomen or pelvis. 2. Colonic diverticulosis without acute inflammation. 3. New mild cardiomegaly. No pericardial effusion. Electronically signed by: Helayne Hurst MD 06/27/2024 07:26 AM EDT RP Workstation: [id number]   Assessment/Plan Virgil CROME Lemaster is a 78 y.o. female with medical history  significant for hypertension, MDS on surveillance and prior diverticulitis being admitted to the hospital with left-sided abdominal pain.  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  It is possible that her abdominal pain is related to constipation or mild colitis, though not as seen on CT scan and pain was quite severe.  Abdominal pain-as above, etiology is unclear.  Given her history of MDS, I also did discuss with Dr. Loretha just to make sure that nothing we should be checking from a hematologic perspective.  She has some anemia which is chronic but otherwise her CBC and leukocyte differential are unremarkable. -Observation admission -Diet as tolerated -Also discussed with general surgery who feels that there is likely no role for them at this time, and I agree.  However they will discuss with attending and get back to us  in case they happen to have any other thoughts. -Pain control as needed, with some supportive IV fluids  Hypertension-resume amlodipine in the morning  GERD-omeprazole twice daily  Constipation-patient states she has not had a bowel movement since 10/10, which is not normal for her.  No nausea, abdomen is soft, I am not sure that her constipation would explain the degree of abdominal pain that she is experiencing.  Although it is possible. -Start scheduled and as needed bowel regimen  MDS-low risk disease, followed by hematology at [facility].  Discussed with Dr. Loretha who does not feel any further inpatient workup with regards to her MDS is necessary.  DVT prophylaxis: Lovenox      Code Status: Full Code  Consults called: Discussed with hematology and general surgery, but formal consultation not required.  Admission status: Observation  Time spent: 45 minutes  Coraima Tibbs CHRISTELLA Gail MD [facility] Pager [phone]  If 7PM-7AM,  please contact night-coverage [email or web address] Password [email or web address]  06/27/2024, 3:28 PM

## 2024-06-27 NOTE — ED Notes (Signed)
[facility] here to transport pt to [facility]

## 2024-06-27 NOTE — ED Triage Notes (Signed)
 Pt reports of LLQ pain and epigastric pain that is tender to palpation that started on Saturday night and has progressively worsened.  Patient denies N/V/D,

## 2024-06-27 NOTE — Plan of Care (Signed)
  Problem: Education: Goal: Knowledge of General Education information will improve Description: Including pain rating scale, medication(s)/side effects and non-pharmacologic comfort measures Outcome: Progressing   Problem: Clinical Measurements: Goal: Diagnostic test results will improve Outcome: Progressing   Problem: Pain Managment: Goal: General experience of comfort will improve and/or be controlled Outcome: Progressing

## 2024-06-27 NOTE — ED Provider Notes (Signed)
 [place] [facility] Provider Note   CSN: [id number] Arrival date & time: 06/27/24  0455     History Chief Complaint  Patient presents with   Abdominal Pain    HPI [name] is a 78 y.o. female presenting for chief complaint of LLQ pain. HX of diverticulitis. Decreased PO. S/P Appendectomy and hysterectomy Decrease output. Denies FCNVSSobCPRash  Patient's recorded medical, surgical, social, medication list and allergies were reviewed in the Snapshot window as part of the initial history.   Review of Systems   Review of Systems  Constitutional:  Negative for chills and fever.  HENT:  Negative for ear pain and sore throat.   Eyes:  Negative for pain and visual disturbance.  Respiratory:  Negative for cough and shortness of breath.   Cardiovascular:  Negative for chest pain and palpitations.  Gastrointestinal:  Positive for abdominal pain. Negative for nausea and vomiting.  Genitourinary:  Negative for dysuria and hematuria.  Musculoskeletal:  Negative for arthralgias and back pain.  Skin:  Negative for color change and rash.  Neurological:  Negative for seizures and syncope.  All other systems reviewed and are negative.   Physical Exam Updated Vital Signs BP 112/63   Pulse 73   Temp 97.8 F (36.6 C) (Oral)   Resp 16   Ht 5' 3 (1.6 m)   Wt 61.2 kg   SpO2 94%   BMI 23.91 kg/m  Physical Exam Vitals and nursing note reviewed.  Constitutional:      General: She is not in acute distress.    Appearance: She is well-developed.  HENT:     Head: Normocephalic and atraumatic.  Eyes:     Conjunctiva/sclera: Conjunctivae normal.  Cardiovascular:     Rate and Rhythm: Normal rate and regular rhythm.     Heart sounds: No murmur heard. Pulmonary:     Effort: Pulmonary effort is normal. No respiratory distress.     Breath sounds: Normal breath sounds.  Abdominal:     General: There is no distension.     Palpations: Abdomen  is soft.     Tenderness: There is abdominal tenderness. There is no right CVA tenderness or left CVA tenderness.  Musculoskeletal:        General: No swelling or tenderness. Normal range of motion.     Cervical back: Neck supple.  Skin:    General: Skin is warm and dry.  Neurological:     General: No focal deficit present.     Mental Status: She is alert and oriented to person, place, and time. Mental status is at baseline.     Cranial Nerves: No cranial nerve deficit.      ED Course/ Medical Decision Making/ A&P    Procedures Procedures   Medications Ordered in ED Medications  fentaNYL (SUBLIMAZE) injection 50 mcg (50 mcg Intravenous Given 06/27/24 0640)  ondansetron  (ZOFRAN ) injection 4 mg (4 mg Intravenous Given 06/27/24 0525)  iohexol  (OMNIPAQUE ) 300 MG/ML solution 80 mL (80 mLs Intravenous Contrast Given 06/27/24 0624)   Medical Decision Making:   [name] is a 78 y.o. female who presented to the ED today with abdominal pain, detailed above.    Patient placed on continuous vitals and telemetry monitoring while in ED which was reviewed periodically.  Complete initial physical exam performed, notably the patient  was HDS in NAD.     Reviewed and confirmed nursing documentation for past medical history, family history, social history.    Initial  Assessment:   With the patient's presentation of abdominal pain, most likely diagnosis is nonspecific etiology. Other diagnoses were considered including (but not limited to) gastroenteritis, colitis, small bowel obstruction, appendicitis, cholecystitis, pancreatitis, nephrolithiasis, UTI, pyleonephritis. These are considered less likely due to history of present illness and physical exam findings.   This is most consistent with an acute life/limb threatening illness complicated by underlying chronic conditions.   Initial Plan:  CBC/CMP to evaluate for underlying infectious/metabolic etiology for patient's abdominal pain   Lipase to evaluate for pancreatitis  EKG to evaluate for cardiac source of pain  CTAB/Pelvis with contrast to evaluate for structural/surgical etiology of patients' severe abdominal pain.  Urinalysis and repeat physical assessment to evaluate for UTI/Pyelonpehritis  Empiric management of symptoms with escalating pain control and antiemetics as needed.   Initial Study Results:   Laboratory  All laboratory results reviewed without evidence of clinically relevant pathology.      EKG EKG was reviewed independently. Rate, rhythm, axis, intervals all examined and without medically relevant abnormality. ST segments without concerns for elevations.    Final Reassessment and Plan:   Reassessed patient.  Pain is much better controlled, she is resting comfortably at this time.  CT of the abdomen pelvis is still pending at this time.  My initial review shows some inflammatory stranding around the descending colon likely related to her history of diverticulitis.  Waiting on radiology review and report for further diagnostic of chronicity and for further differentiation of severity of presentation. Patient informed of plan of care at this time.  Still pending abdominal pelvis CT for further disposition.     Clinical Impression:  1. Abdominal pain, unspecified abdominal location      Discharge   Final Clinical Impression(s) / ED Diagnoses Final diagnoses:  Abdominal pain, unspecified abdominal location    Rx / DC Orders ED Discharge Orders     None         Jerral Meth, MD 06/27/24 [phone]

## 2024-06-28 ENCOUNTER — Observation Stay (HOSPITAL_COMMUNITY)

## 2024-06-28 DIAGNOSIS — R1012 Left upper quadrant pain: Secondary | ICD-10-CM | POA: Diagnosis not present

## 2024-06-28 DIAGNOSIS — D46Z Other myelodysplastic syndromes: Secondary | ICD-10-CM | POA: Diagnosis present

## 2024-06-28 DIAGNOSIS — K5900 Constipation, unspecified: Secondary | ICD-10-CM | POA: Diagnosis present

## 2024-06-28 DIAGNOSIS — I10 Essential (primary) hypertension: Secondary | ICD-10-CM | POA: Diagnosis present

## 2024-06-28 LAB — CBC
HCT: 27 % — ABNORMAL LOW (ref 36.0–46.0)
Hemoglobin: 8.5 g/dL — ABNORMAL LOW (ref 12.0–15.0)
MCH: 33.3 pg (ref 26.0–34.0)
MCHC: 31.5 g/dL (ref 30.0–36.0)
MCV: 105.9 fL — ABNORMAL HIGH (ref 80.0–100.0)
Platelets: 164 K/uL (ref 150–400)
RBC: 2.55 MIL/uL — ABNORMAL LOW (ref 3.87–5.11)
RDW: 23.2 % — ABNORMAL HIGH (ref 11.5–15.5)
WBC: 4.5 K/uL (ref 4.0–10.5)
nRBC: 0 % (ref 0.0–0.2)

## 2024-06-28 LAB — BASIC METABOLIC PANEL WITH GFR
Anion gap: 10 (ref 5–15)
BUN: 15 mg/dL (ref 8–23)
CO2: 26 mmol/L (ref 22–32)
Calcium: 8.7 mg/dL — ABNORMAL LOW (ref 8.9–10.3)
Chloride: 104 mmol/L (ref 98–111)
Creatinine, Ser: 0.72 mg/dL (ref 0.44–1.00)
GFR, Estimated: >60 mL/min (ref >60)
Glucose, Bld: 97 mg/dL (ref 70–99)
Potassium: 4 mmol/L (ref 3.5–5.1)
Sodium: 139 mmol/L (ref 135–145)

## 2024-06-28 MED ORDER — SUCRALFATE 1 GM/10ML PO SUSP: 1.0000 g | Freq: Four times a day (QID) | ORAL | 0 refills | Status: AC | PRN

## 2024-06-28 MED ORDER — POLYETHYLENE GLYCOL 3350 17 G PO PACK: 17.0000 g | PACK | Freq: Every day | ORAL | Status: DC

## 2024-06-28 MED ORDER — ONDANSETRON HCL 4 MG PO TABS: 4.0000 mg | ORAL_TABLET | Freq: Four times a day (QID) | ORAL | 0 refills | Status: AC | PRN

## 2024-06-28 MED ORDER — DOCUSATE SODIUM 100 MG PO CAPS: 100.0000 mg | ORAL_CAPSULE | Freq: Two times a day (BID) | ORAL | 0 refills | Status: AC

## 2024-06-28 MED ORDER — OXYCODONE HCL 5 MG PO TABS: 5.0000 mg | ORAL_TABLET | ORAL | 0 refills | Status: AC | PRN

## 2024-06-28 MED ORDER — HYDROMORPHONE HCL 1 MG/ML IJ SOLN
0.5000 mg | Freq: Once | INTRAMUSCULAR | Status: AC
  Administered 2024-06-28: 0.5 mg via INTRAVENOUS
  Filled 2024-06-28: qty 0.5

## 2024-06-28 MED ORDER — BISACODYL 5 MG PO TBEC: 5.0000 mg | DELAYED_RELEASE_TABLET | Freq: Once | ORAL | Status: DC

## 2024-06-28 MED ORDER — FLEET ENEMA RE ENEM: 1.0000 | ENEMA | Freq: Once | RECTAL | 0 refills | Status: AC | PRN

## 2024-06-28 MED ORDER — POLYETHYLENE GLYCOL 3350 17 G PO PACK: 17.0000 g | PACK | Freq: Every day | ORAL | 0 refills | Status: AC

## 2024-06-28 NOTE — Discharge Summary (Signed)
 Physician Discharge Summary   Patient: [name] MRN: [id number] DOB: 1946-08-05  Admit date:     06/27/2024  Discharge date: 06/28/24  Discharge Physician: Delon Herald   PCP: Rena Luke POUR, MD   Recommendations at discharge:   Unremarkable abdominal for cause of your abdominal pain A good bowel regimen is essential Will also add Carafate Follow up with Dr. Rena later this week for recheck Return to the ER should abdominal pain worsen or new symptoms develop You are being prescribed a limited number of narcotic pain pills; take as directed and do not drive or make important decisions while taking this medication   Discharge Diagnoses: Principal Problem:   Left upper quadrant abdominal pain Active Problems:   Constipation   Essential hypertension   MDS (myelodysplastic syndrome), low grade [facility])    Hospital Course: 78yo with h/o HTN and MDS on surveillance who presented on 10/13 with diffuse abdominal pain.  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  Since then, she has not had BM and has had progressive abdominal pain.  CT with substantially increased splenic volume since 2016 and otherwise unremarkable.  Abdominal US  ordered.  Will give bowel regimen and Carafate if US  is negative and then DC.  Assessment and Plan:  Abdominal pain Etiology is unclear MRCP on 10/10 with stable pancreatic cyst and small splenic hemangiomas with borderline splenomegaly Given her history of MDS, admitter discussed patient with Dr. Loretha  Observed overnight Tolerating diet Also discussed with general surgery who feels that there is likely no role for them at this time Abdominal US  ordered, if unremarkable will dc Needs good bowel regimen and will add Carafate   Hypertension Resume amlodipine   GERD Continue PPI BID   Constipation Patient states she has not had a bowel movement since 10/10, which is not normal  for her No nausea, abdomen is soft Constipation may not explain the degree of abdominal pain that she is experiencing Start scheduled and as needed bowel regimen   MDS Low risk disease, followed by hematology at [facility] Discussed with Dr. Loretha who does not feel any further inpatient workup with regards to her MDS is necessary     Consultants: Oncology, telephone only Surgery, telephone only  Procedures: None  Antibiotics: None    Pain control - [place]  Controlled Substance Reporting System database was reviewed. and patient was instructed, not to drive, operate heavy machinery, perform activities at heights, swimming or participation in water activities or provide baby-sitting services while on Pain, Sleep and Anxiety Medications; until their outpatient Physician has advised to do so again. Also recommended to not to take more than prescribed Pain, Sleep and Anxiety Medications.   Disposition: Home Diet recommendation:  Regular diet DISCHARGE MEDICATION: Allergies as of 06/28/2024       Reactions   Morphine And Codeine Itching   Promethazine Hcl Itching   Zoloft [sertraline Hcl] Other (See Comments)   Headache        Medication List     TAKE these medications    acetaminophen  650 MG CR tablet Commonly known as: TYLENOL  Take 650 mg by mouth every 8 (eight) hours as needed for pain.   amLODipine 5 MG tablet Commonly known as: NORVASC Take 5 mg by mouth in the morning.   calcium carbonate 500 MG chewable tablet Commonly known as: TUMS - dosed in mg elemental calcium Chew 1 tablet by mouth as needed for indigestion or heartburn.  docusate sodium 100 MG capsule Commonly known as: COLACE Take 1 capsule (100 mg total) by mouth 2 (two) times daily.   ondansetron  4 MG tablet Commonly known as: ZOFRAN  Take 1 tablet (4 mg total) by mouth every 6 (six) hours as needed for nausea.   oxyCODONE  5 MG immediate release tablet Commonly known as: Oxy  IR/ROXICODONE  Take 1 tablet (5 mg total) by mouth every 4 (four) hours as needed for up to 10 doses for moderate pain (pain score 4-6).   pantoprazole 40 MG tablet Commonly known as: PROTONIX Take 40 mg by mouth in the morning.   polyethylene glycol 17 g packet Commonly known as: MIRALAX / GLYCOLAX Take 17 g by mouth daily. Start taking on: June 29, 2024   rosuvastatin 10 MG tablet Commonly known as: CRESTOR Take 10 mg by mouth in the morning.   sodium phosphate Enem Place 133 mLs (1 enema total) rectally once as needed for up to 1 dose for severe constipation.   sucralfate 1 GM/10ML suspension Commonly known as: Carafate Take 10 mLs (1 g total) by mouth 4 (four) times daily as needed (heartburn).        Follow-up Information     Schedule an appointment as soon as possible for a visit  with [facility] with your PCP/Specialist as discussed.   Contact information: [email or web address] Call our physician referral line at [phone].               Discharge Exam:   Subjective: Continues to have diffuse abdominal pain.  Has not yet had a BM.   Objective: Vitals:   06/28/24 0515 06/28/24 1317  BP: (!) 113/55 112/60  Pulse: 71 72  Resp:  16  Temp: 98.2 F (36.8 C) 98.3 F (36.8 C)  SpO2: 98% 97%    Intake/Output Summary (Last 24 hours) at 06/28/2024 1706 Last data filed at 06/28/2024 0500 Gross per 24 hour  Intake 1081 ml  Output --  Net 1081 ml   Filed Weights   06/27/24 0508  Weight: 61.2 kg    Exam:  General:  Appears calm and comfortable and is in NAD Eyes:  normal lids, iris ENT:  grossly normal hearing, lips & tongue, mmm Cardiovascular:  RRR, no m/r/g. No LE edema.  Respiratory:   CTA bilaterally with no wheezes/rales/rhonchi.  Normal respiratory effort. Abdomen:  soft, mild diffuse abdominal TTP, ND Skin:  no rash or induration seen on limited exam Musculoskeletal:  grossly normal tone BUE/BLE, good ROM, no bony  abnormality Psychiatric:  grossly normal mood and affect, speech fluent and appropriate, AOx3 Neurologic:  CN 2-12 grossly intact, moves all extremities in coordinated fashion  Data Reviewed: I have reviewed the patient's lab results since admission.  Pertinent labs for today include:   Stable BMP WBC 4.5 Hgb 8.5, 8.3 on 10/6    Condition at discharge: stable  The results of significant diagnostics from this hospitalization (including imaging, microbiology, ancillary and laboratory) are listed below for reference.   Imaging Studies: US  Abdomen Complete Result Date: 06/28/2024 CLINICAL DATA:  [id number] Abdominal pain [id number] EXAM: ABDOMEN ULTRASOUND COMPLETE COMPARISON:  June 27, 2024 FINDINGS: Gallbladder: No gallstones. No wall thickening or pericholecystic fluid. No sonographic Murphy's sign noted by sonographer. Common bile duct: Diameter: 4 mm Liver: Increased echogenicity. No focal lesion identified. No intrahepatic biliary ductal dilation. Portal vein is patent on color Doppler imaging with normal direction of blood flow towards the liver. IVC: No abnormality visualized. Pancreas: Visualized portion unremarkable. Spleen: At the  upper limits of normal for size measuring 12.7 cm. Small anechoic lesion in the splenic parenchyma measuring 5 mm. The remainder of the hypodense lesions throughout the spleen on the prior CT are not well visualized on today's ultrasound. Right Kidney: Length: 9.7 cm. Normal echogenicity. No mass. No hydronephrosis or nephrolithiasis. Left Kidney: Length: 9.5 cm. Normal echogenicity. No mass. No hydronephrosis or nephrolithiasis. Abdominal aorta: No aneurysm visualized. Other findings: None. IMPRESSION: 1. The spleen is at the upper limits of normal for size, measuring 12.7 cm. Small anechoic lesion in the splenic parenchyma measuring 5 mm, possibly a small splenic cyst. The remainder of the hypodense lesions throughout the spleen on the prior CT are not well  visualized on today's ultrasound. 2. Hepatic steatosis. Electronically Signed   By: Rogelia Myers M.D.   On: 06/28/2024 16:56   CT ABDOMEN PELVIS W CONTRAST Result Date: 06/27/2024 EXAM: CT ABDOMEN AND PELVIS WITH CONTRAST 06/27/2024 06:25:00 AM TECHNIQUE: CT of the abdomen and pelvis was performed with the administration of 80 mL of iohexol  (OMNIPAQUE ) 300 MG/ML solution. Multiplanar reformatted images are provided for review. Automated exposure control, iterative reconstruction, and/or weight-based adjustment of the mA/kV was utilized to reduce the radiation dose to as low as reasonably achievable. COMPARISON: CT abdomen and pelvis 01/25/2015. CLINICAL HISTORY: 78 year old female with acute, nonlocalized abdominal pain, LLQ and epigastric tenderness, progressively worsening, stone suspected. FINDINGS: LOWER CHEST: Mild cardiomegaly is new since 2016. No pericardial effusion. Chronic elevation of the right hemidiaphragm. Lower lung volumes with increased bibasilar atelectasis and gas trapping. LIVER: The liver is unremarkable. GALLBLADDER AND BILE DUCTS: Gallbladder is unremarkable. No biliary ductal dilatation. SPLEEN: Innumerable splenic hypodense lesions now, with only 1 such lesion visible in 2016. Hypodense rounded areas in the spleen range from punctate up to 16 mm now. The spleen measures 128 x 139 x 84 mm. Estimated volume of spleen: 393 mL. Compared to 209 mL in 2016. Normal splenic volume range is 83 to 412 mL. No perisplenic fluid or inflammatory stranding. PANCREAS: No acute abnormality. ADRENAL GLANDS: No acute abnormality. KIDNEYS, URETERS AND BLADDER: No stones in the kidneys or ureters. No hydronephrosis. No perinephric or periureteral stranding. Urinary bladder is diminutive. GI AND BOWEL: Stomach demonstrates no acute abnormality. Moderate large bowel retained stool throughout the abdomen and pelvis. Intermittent large bowel diverticulosis, extensive in the sigmoid colon. No active  inflammation associated. Diminutive or absent appendix. Non dilated small bowel. There is no bowel obstruction. PERITONEUM AND RETROPERITONEUM: No ascites. No free air. No pneumoperitoneum. No mesenteric inflammation identified. VASCULATURE: Aorta is normal in caliber. Mild for age calcified aortic atherosclerosis. Major arterial structures and portal venous system are patent. LYMPH NODES: No lymphadenopathy. REPRODUCTIVE ORGANS: Chronically absent uterus, diminutive or absent ovaries. Chronic pelvic floor laxity suspected. Mild chronic rectocele. BONES AND SOFT TISSUES: Progressive lumbar spine disc degeneration with multilevel vacuum disc. Progressed endplate spurring and moderate facet arthropathy. Pelvic phleboliths. No acute osseous abnormality. No focal soft tissue abnormality. IMPRESSION: 1. New since 2016 innumerable hypodense splenic lesions up to 16 mm, and substantially increased splenic volume since that time. No superimposed lymphadenopathy. Still, consider hematologic and infiltrative etiologies (such as lymphproliferative disorders, sarcoidosis). Splenic infection/abscess was considered but felt unlikely given absent regional edema, and lack of other acute or inflammatory process in the abdomen or pelvis. 2. Colonic diverticulosis without acute inflammation. 3. New mild cardiomegaly. No pericardial effusion. Electronically signed by: Helayne Hurst MD 06/27/2024 07:26 AM EDT RP Workstation: [id number]    Microbiology: Results for orders  placed or performed during the hospital encounter of 01/25/15  Urine culture     Status: None   Collection Time: 01/25/15  2:31 AM   Specimen: Urine, Clean Catch  Result Value Ref Range Status   Specimen Description URINE, CLEAN CATCH  Final   Special Requests NONE  Final   Colony Count   Final    3,000 COLONIES/ML Performed at [facility]    Culture   Final    INSIGNIFICANT GROWTH Performed at [facility]    Report Status 01/26/2015  FINAL  Final    Labs: CBC: Recent Labs  Lab 06/27/24 0511 06/27/24 0530 06/28/24 0437  WBC 4.5  --  4.5  NEUTROABS  --  2.6  --   HGB 9.6*  --  8.5*  HCT 28.7*  --  27.0*  MCV 100.0  --  105.9*  PLT 198  --  164   Basic Metabolic Panel: Recent Labs  Lab 06/27/24 0511 06/28/24 0437  NA 141 139  K 4.2 4.0  CL 104 104  CO2 25 26  GLUCOSE 107* 97  BUN 12 15  CREATININE 0.73 0.72  CALCIUM 9.0 8.7*   Liver Function Tests: Recent Labs  Lab 06/27/24 0511  AST 20  ALT 13  ALKPHOS 70  BILITOT 0.9  PROT 6.5  ALBUMIN 4.5   CBG: No results for input(s): GLUCAP in the last 168 hours.  Discharge time spent: greater than 30 minutes.  Signed: Delon Herald, MD [facility] 06/28/2024

## 2024-06-28 NOTE — Progress Notes (Signed)
 Discharge instructions given to patient questions asked and answered.

## 2024-06-28 NOTE — Plan of Care (Signed)
   Problem: Coping: Goal: Level of anxiety will decrease Outcome: Progressing   Problem: Elimination: Goal: Will not experience complications related to bowel motility Outcome: Progressing

## 2024-06-28 NOTE — Plan of Care (Signed)
  Problem: Clinical Measurements: Goal: Ability to maintain clinical measurements within normal limits will improve Outcome: Progressing   Problem: Clinical Measurements: Goal: Will remain free from infection Outcome: Progressing    Problem: Nutrition: Goal: Adequate nutrition will be maintained Outcome: Progressing   Problem: Pain Managment: Goal: General experience of comfort will improve and/or be controlled Outcome: Progressing

## 2024-06-28 NOTE — Hospital Course (Signed)
 78yo with h/o HTN and MDS on surveillance who presented on 10/13 with diffuse abdominal pain.  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  Since then, she has not had BM and has had progressive abdominal pain.  CT with substantially increased splenic volume since 2016 and otherwise unremarkable.  Abdominal US  ordered.  Will give bowel regimen and Carafate if US  is negative and then DC.

## 2024-06-28 NOTE — Plan of Care (Signed)
  Problem: Education: Goal: Knowledge of General Education information will improve Description: Including pain rating scale, medication(s)/side effects and non-pharmacologic comfort measures 06/28/2024 1736 by Yelitza Reach C, RN Outcome: Adequate for Discharge 06/28/2024 1458 by Gordon Carolyn BROCKS, RN Outcome: Progressing   Problem: Health Behavior/Discharge Planning: Goal: Ability to manage health-related needs will improve 06/28/2024 1736 by Gurnie Duris C, RN Outcome: Adequate for Discharge 06/28/2024 1458 by Gordon Carolyn BROCKS, RN Outcome: Progressing   Problem: Clinical Measurements: Goal: Ability to maintain clinical measurements within normal limits will improve 06/28/2024 1736 by Kenzee Bassin C, RN Outcome: Adequate for Discharge 06/28/2024 1458 by Gordon Carolyn BROCKS, RN Outcome: Progressing Goal: Will remain free from infection 06/28/2024 1736 by Brevon Dewald C, RN Outcome: Adequate for Discharge 06/28/2024 1458 by Makiah Clauson C, RN Outcome: Progressing Goal: Diagnostic test results will improve 06/28/2024 1736 by Gudrun Axe C, RN Outcome: Adequate for Discharge 06/28/2024 1458 by Donavan Kerlin C, RN Outcome: Progressing Goal: Respiratory complications will improve 06/28/2024 1736 by Bryna Razavi C, RN Outcome: Adequate for Discharge 06/28/2024 1458 by Sherlonda Flater C, RN Outcome: Progressing Goal: Cardiovascular complication will be avoided 06/28/2024 1736 by Sorcha Rotunno C, RN Outcome: Adequate for Discharge 06/28/2024 1458 by Shakyra Mattera C, RN Outcome: Progressing   Problem: Activity: Goal: Risk for activity intolerance will decrease 06/28/2024 1736 by Gaspard Isbell C, RN Outcome: Adequate for Discharge 06/28/2024 1458 by Gordon Carolyn BROCKS, RN Outcome: Progressing   Problem: Nutrition: Goal: Adequate nutrition will be maintained 06/28/2024 1736 by Jaeli Grubb C, RN Outcome: Adequate for Discharge 06/28/2024 1458  by Gordon Carolyn BROCKS, RN Outcome: Progressing   Problem: Coping: Goal: Level of anxiety will decrease 06/28/2024 1736 by Gordon Carolyn BROCKS, RN Outcome: Adequate for Discharge 06/28/2024 1458 by Gordon Carolyn BROCKS, RN Outcome: Progressing   Problem: Elimination: Goal: Will not experience complications related to bowel motility 06/28/2024 1736 by Analeia Ismael C, RN Outcome: Adequate for Discharge 06/28/2024 1458 by Maythe Deramo C, RN Outcome: Progressing Goal: Will not experience complications related to urinary retention 06/28/2024 1736 by Sloane Junkin C, RN Outcome: Adequate for Discharge 06/28/2024 1458 by Gabrielle Mester C, RN Outcome: Progressing   Problem: Pain Managment: Goal: General experience of comfort will improve and/or be controlled 06/28/2024 1736 by Ridge Lafond C, RN Outcome: Adequate for Discharge 06/28/2024 1458 by Calogero Geisen C, RN Outcome: Progressing   Problem: Safety: Goal: Ability to remain free from injury will improve 06/28/2024 1736 by Isobella Ascher C, RN Outcome: Adequate for Discharge 06/28/2024 1458 by Christyn Gutkowski C, RN Outcome: Progressing   Problem: Skin Integrity: Goal: Risk for impaired skin integrity will decrease 06/28/2024 1736 by Lillyian Heidt C, RN Outcome: Adequate for Discharge 06/28/2024 1458 by Yashika Mask C, RN Outcome: Progressing

## 2024-06-28 NOTE — Progress Notes (Signed)
   06/28/24 0836  TOC Brief Assessment  Insurance and Status Reviewed  Patient has primary care physician Yes  Home environment has been reviewed single family home  Prior level of function: independent  Prior/Current Home Services No current home services  Social Drivers of Health Review SDOH reviewed no interventions necessary  Readmission risk has been reviewed Yes  Transition of care needs no transition of care needs at this time    Signed: Heather Saltness, MSW, LCSW Clinical Social Worker Inpatient Care Management 06/28/2024 8:36 AM

## 2024-06-28 NOTE — Care Management Obs Status (Signed)
 MEDICARE OBSERVATION STATUS NOTIFICATION   Patient Details  Name: MERRIL ISAKSON MRN: [id number] Date of Birth: 03/17/1946   Medicare Observation Status Notification Given:  Yes    Heather DELENA Saltness, LCSW 06/28/2024, 9:54 AM
